# Patient Record
Sex: Female | Born: 1945 | Race: White | Hispanic: No | Marital: Single | Smoking: Former smoker
Health system: Southern US, Community
[De-identification: ages and names within clinical notes are randomized; demographics above are authoritative.]

## PROBLEM LIST (undated history)

## (undated) DIAGNOSIS — J449 Chronic obstructive pulmonary disease, unspecified: Secondary | ICD-10-CM

## (undated) DIAGNOSIS — IMO0001 Reserved for inherently not codable concepts without codable children: Secondary | ICD-10-CM

## (undated) DIAGNOSIS — I2781 Cor pulmonale (chronic): Secondary | ICD-10-CM

## (undated) DIAGNOSIS — J189 Pneumonia, unspecified organism: Secondary | ICD-10-CM

## (undated) DIAGNOSIS — K219 Gastro-esophageal reflux disease without esophagitis: Secondary | ICD-10-CM

## (undated) DIAGNOSIS — M1990 Unspecified osteoarthritis, unspecified site: Secondary | ICD-10-CM

## (undated) DIAGNOSIS — G319 Degenerative disease of nervous system, unspecified: Secondary | ICD-10-CM

## (undated) DIAGNOSIS — R519 Headache, unspecified: Secondary | ICD-10-CM

## (undated) DIAGNOSIS — I10 Essential (primary) hypertension: Secondary | ICD-10-CM

## (undated) DIAGNOSIS — I499 Cardiac arrhythmia, unspecified: Secondary | ICD-10-CM

## (undated) DIAGNOSIS — R296 Repeated falls: Secondary | ICD-10-CM

## (undated) DIAGNOSIS — R002 Palpitations: Secondary | ICD-10-CM

## (undated) DIAGNOSIS — K5900 Constipation, unspecified: Secondary | ICD-10-CM

## (undated) DIAGNOSIS — R51 Headache: Secondary | ICD-10-CM

## (undated) HISTORY — PX: EYE SURGERY: SHX253

## (undated) HISTORY — DX: Essential (primary) hypertension: I10

## (undated) HISTORY — DX: Gastro-esophageal reflux disease without esophagitis: K21.9

## (undated) HISTORY — PX: ABDOMINAL HYSTERECTOMY: SHX81

## (undated) HISTORY — PX: CHOLECYSTECTOMY: SHX55

## (undated) HISTORY — PX: COLONOSCOPY: SHX174

## (undated) HISTORY — PX: APPENDECTOMY: SHX54

---

## 2015-10-11 ENCOUNTER — Encounter (INDEPENDENT_AMBULATORY_CARE_PROVIDER_SITE_OTHER): Payer: Self-pay | Admitting: *Deleted

## 2015-11-01 ENCOUNTER — Encounter (INDEPENDENT_AMBULATORY_CARE_PROVIDER_SITE_OTHER): Payer: Self-pay | Admitting: Internal Medicine

## 2015-11-01 ENCOUNTER — Encounter (INDEPENDENT_AMBULATORY_CARE_PROVIDER_SITE_OTHER): Payer: Self-pay | Admitting: *Deleted

## 2015-11-01 ENCOUNTER — Other Ambulatory Visit (INDEPENDENT_AMBULATORY_CARE_PROVIDER_SITE_OTHER): Payer: Self-pay | Admitting: Internal Medicine

## 2015-11-01 ENCOUNTER — Ambulatory Visit (INDEPENDENT_AMBULATORY_CARE_PROVIDER_SITE_OTHER): Payer: Medicare Other | Admitting: Internal Medicine

## 2015-11-01 ENCOUNTER — Encounter (INDEPENDENT_AMBULATORY_CARE_PROVIDER_SITE_OTHER): Payer: Self-pay

## 2015-11-01 VITALS — BP 152/70 | HR 60 | Temp 98.0°F | Ht 63.0 in | Wt 165.0 lb

## 2015-11-01 DIAGNOSIS — K219 Gastro-esophageal reflux disease without esophagitis: Secondary | ICD-10-CM | POA: Diagnosis not present

## 2015-11-01 DIAGNOSIS — I10 Essential (primary) hypertension: Secondary | ICD-10-CM | POA: Diagnosis not present

## 2015-11-01 DIAGNOSIS — J209 Acute bronchitis, unspecified: Secondary | ICD-10-CM

## 2015-11-01 MED ORDER — AZITHROMYCIN 250 MG PO TABS: ORAL_TABLET | ORAL | Status: DC

## 2015-11-01 NOTE — Patient Instructions (Addendum)
EGD.The risks and benefits such as perforation, bleeding, and infection were reviewed with the patient and is agreeable. Protonix 30 minutes before breakfast and Zantac at night. Rx for a Z pack sent to her pharmacy for her chronic bronchitis.

## 2015-11-01 NOTE — Progress Notes (Addendum)
Subjective:    Patient ID: Angelica KerbsSylvia A Garza, female    DOB: Nov 17, 1945, 70 y.o.   MRN: [id number]  [facility]. DG esophagram on 10/02/2015 revealed no evidence of esophageal stricture. Moderate to large sliding type hiatal hernia with spontaneous gastroesophageal reflux to the thoraci inlet on supine imaging. She tells me she has a hernia. She says she has nausea and vomiting all day long.  She says she vomits any kind of food. No weight loss.  She usually has a BM x 1 a day and sometimes she may have 2 BMs.  She takes Miralax for constipation. Her last colonoscopy was just over a year by Dr. Allena KatzPatel and was normal.  10/18/2015 CT Abd/pelvis with CM: abdominal pain/ nausea and vomiting: There is no nausea before she eats.   A small hiatal hernia is identified. Diverticulosis without diverticulitis. Likely small cyst left kidney. She has seen Dr .Allena KatzPatel in the past. She has never undergone an EGD in the past.  Also c/o cough. Hx of chronic bronchitis.   Review of Systems Past Medical History  Diagnosis Date  . Hypertension   . GERD (gastroesophageal reflux disease)     Past Surgical History  Procedure Laterality Date  . Cholecystectomy      Allergies  Allergen Reactions  . Codeine     Muscle spasms    No current outpatient prescriptions on file prior to visit.   No current facility-administered medications on file prior to visit.   Current Outpatient Prescriptions  Medication Sig Dispense Refill  . amLODipine (NORVASC) 5 MG tablet Take 5 mg by mouth daily.    . butabarbital (BUTISOL) 50 MG TABS tablet Take 50 mg by mouth.    . fluticasone furoate-vilanterol (BREO ELLIPTA) 100-25 MCG/INH AEPB Inhale 1 puff into the lungs daily.    Marland Kitchen. gabapentin (NEURONTIN) 300 MG capsule Take 300 mg by mouth 3 (three) times daily.    Marland Kitchen. losartan (COZAAR) 100 MG tablet Take by mouth daily.    . Multiple Vitamins-Minerals ([employer] SPECTRAVITE ADULT 50+ PO) Take by mouth.     . oxybutynin (DITROPAN-XL) 10 MG 24 hr tablet Take 10 mg by mouth at bedtime.    . pantoprazole (PROTONIX) 40 MG tablet Take 40 mg by mouth daily.    . pregabalin (LYRICA) 100 MG capsule Take 100 mg by mouth 2 (two) times daily.    . primidone (MYSOLINE) 50 MG tablet Take 25 mg by mouth at bedtime.    . trandolapril-verapamil (TARKA) 2-180 MG tablet Take 1 tablet by mouth daily.    . traZODone (DESYREL) 100 MG tablet Take 100 mg by mouth at bedtime.     No current facility-administered medications for this visit.         Objective:   Physical ExamBlood pressure 152/70, pulse 60, temperature 98 F (36.7 C), height 5\' 3"  (1.6 m), weight 165 lb (74.844 kg). Alert and oriented. Skin warm and dry. Oral mucosa is moist.   . Sclera anicteric, conjunctivae is pink. Thyroid not enlarged. No cervical lymphadenopathy. Bilateral wheezes. Heart regular rate and rhythm.  Abdomen is soft. Bowel sounds are positive. No hepatomegaly. No abdominal masses felt. No tenderness.  No edema to lower extremities.         Assessment & Plan:  GERD not controlled at this time. DGd esophagram shows large hiatal hernia with reflux.  Continue the Protonix.  Take Zantac at night.  Keep HOB. She sleeps in a recliner.  Chronic bronchitis; Z pack sent to her pharmacy.

## 2015-11-23 ENCOUNTER — Ambulatory Visit (HOSPITAL_COMMUNITY)
Admission: RE | Admit: 2015-11-23 | Discharge: 2015-11-23 | Disposition: A | Discharge status: Home / Self Care | Payer: Medicare Other | Source: Ambulatory Visit | Attending: Internal Medicine | Admitting: Internal Medicine

## 2015-11-23 ENCOUNTER — Encounter (HOSPITAL_COMMUNITY)
Admission: RE | Disposition: A | Discharge status: Home / Self Care | Payer: Self-pay | Source: Ambulatory Visit | Attending: Internal Medicine

## 2015-11-23 ENCOUNTER — Encounter (HOSPITAL_COMMUNITY): Payer: Self-pay | Admitting: *Deleted

## 2015-11-23 DIAGNOSIS — K2950 Unspecified chronic gastritis without bleeding: Secondary | ICD-10-CM | POA: Insufficient documentation

## 2015-11-23 DIAGNOSIS — Z79899 Other long term (current) drug therapy: Secondary | ICD-10-CM | POA: Diagnosis not present

## 2015-11-23 DIAGNOSIS — K449 Diaphragmatic hernia without obstruction or gangrene: Secondary | ICD-10-CM | POA: Insufficient documentation

## 2015-11-23 DIAGNOSIS — K219 Gastro-esophageal reflux disease without esophagitis: Secondary | ICD-10-CM | POA: Diagnosis present

## 2015-11-23 DIAGNOSIS — K210 Gastro-esophageal reflux disease with esophagitis: Secondary | ICD-10-CM | POA: Diagnosis not present

## 2015-11-23 DIAGNOSIS — R002 Palpitations: Secondary | ICD-10-CM | POA: Diagnosis not present

## 2015-11-23 DIAGNOSIS — K319 Disease of stomach and duodenum, unspecified: Secondary | ICD-10-CM | POA: Insufficient documentation

## 2015-11-23 DIAGNOSIS — Z87891 Personal history of nicotine dependence: Secondary | ICD-10-CM | POA: Insufficient documentation

## 2015-11-23 DIAGNOSIS — J449 Chronic obstructive pulmonary disease, unspecified: Secondary | ICD-10-CM | POA: Insufficient documentation

## 2015-11-23 DIAGNOSIS — K259 Gastric ulcer, unspecified as acute or chronic, without hemorrhage or perforation: Secondary | ICD-10-CM | POA: Insufficient documentation

## 2015-11-23 DIAGNOSIS — K2970 Gastritis, unspecified, without bleeding: Secondary | ICD-10-CM | POA: Diagnosis not present

## 2015-11-23 DIAGNOSIS — I10 Essential (primary) hypertension: Secondary | ICD-10-CM | POA: Insufficient documentation

## 2015-11-23 HISTORY — DX: Chronic obstructive pulmonary disease, unspecified: J44.9

## 2015-11-23 HISTORY — DX: Constipation, unspecified: K59.00

## 2015-11-23 HISTORY — DX: Unspecified osteoarthritis, unspecified site: M19.90

## 2015-11-23 HISTORY — DX: Palpitations: R00.2

## 2015-11-23 HISTORY — PX: ESOPHAGOGASTRODUODENOSCOPY: SHX5428

## 2015-11-23 HISTORY — DX: Reserved for inherently not codable concepts without codable children: IMO0001

## 2015-11-23 SURGERY — EGD (ESOPHAGOGASTRODUODENOSCOPY)
Anesthesia: Moderate Sedation

## 2015-11-23 MED ORDER — BUTAMBEN-TETRACAINE-BENZOCAINE 2-2-14 % EX AERO
INHALATION_SPRAY | CUTANEOUS | Status: AC
  Filled 2015-11-23: qty 20

## 2015-11-23 MED ORDER — BUTAMBEN-TETRACAINE-BENZOCAINE 2-2-14 % EX AERO
INHALATION_SPRAY | CUTANEOUS | Status: DC | PRN
  Administered 2015-11-23: 2 via TOPICAL

## 2015-11-23 MED ORDER — METOCLOPRAMIDE HCL 10 MG PO TABS: 5.0000 mg | ORAL_TABLET | Freq: Three times a day (TID) | ORAL | Status: AC

## 2015-11-23 MED ORDER — MIDAZOLAM HCL 5 MG/5ML IJ SOLN
INTRAMUSCULAR | Status: AC
  Filled 2015-11-23: qty 10

## 2015-11-23 MED ORDER — MEPERIDINE HCL 50 MG/ML IJ SOLN
INTRAMUSCULAR | Status: DC | PRN
  Administered 2015-11-23 (×2): 25 mg via INTRAVENOUS

## 2015-11-23 MED ORDER — SODIUM CHLORIDE 0.9 % IV SOLN
INTRAVENOUS | Status: DC
  Administered 2015-11-23: 12:00:00 via INTRAVENOUS

## 2015-11-23 MED ORDER — STERILE WATER FOR IRRIGATION IR SOLN
Status: DC | PRN
  Administered 2015-11-23: 13:00:00

## 2015-11-23 MED ORDER — MIDAZOLAM HCL 5 MG/5ML IJ SOLN
INTRAMUSCULAR | Status: DC | PRN
  Administered 2015-11-23 (×2): 2 mg via INTRAVENOUS

## 2015-11-23 MED ORDER — MEPERIDINE HCL 50 MG/ML IJ SOLN
INTRAMUSCULAR | Status: AC
  Filled 2015-11-23: qty 1

## 2015-11-23 NOTE — H&P (Signed)
Angelica Garza is an 70 y.o. female.   Chief Complaint: Patient is here for EGD. HPI: Patient is 70 year old Caucasian female was at symptoms of GERD for more than 10 years. She says her symptoms been poorly controlled over the last one year. She has daily heartburn and postprandial regurgitation and/or vomiting. She denies hematemesis melena or rectal bleeding. She states she has lost pounds. She does not eat meat. She is not to meet with her dentures. Recent upper GI series revealed moderate to large sliding hiatal hernia. She has never undergone EGD before.  Past Medical History  Diagnosis Date  . Hypertension   . GERD (gastroesophageal reflux disease)   . Heart palpitations   . COPD (chronic obstructive pulmonary disease) (HCC)   . Shortness of breath dyspnea   . Constipation   . Degenerative arthritis     Past Surgical History  Procedure Laterality Date  . Cholecystectomy    . Abdominal hysterectomy    . Colonoscopy      History reviewed. No pertinent family history. Social History:  reports that she has quit smoking. Her smoking use included Cigarettes. She has a 15 pack-year smoking history. She does not have any smokeless tobacco history on file. She reports that she does not drink alcohol or use illicit drugs.  Allergies:  Allergies  Allergen Reactions  . Codeine     Muscle spasms    Medications Prior to Admission  Medication Sig Dispense Refill  . amLODipine (NORVASC) 5 MG tablet Take 5 mg by mouth daily.    Marland Kitchen. azithromycin (ZITHROMAX Z-PAK) 250 MG tablet As directed 6 each 0  . cyanocobalamin (,VITAMIN B-12,) 1000 MCG/ML injection Inject 1,000 mcg into the muscle every 30 (thirty) days.    . fluticasone furoate-vilanterol (BREO ELLIPTA) 100-25 MCG/INH AEPB Inhale 1 puff into the lungs daily.    Marland Kitchen. gabapentin (NEURONTIN) 300 MG capsule Take 300 mg by mouth 3 (three) times daily.    Marland Kitchen. guaiFENesin-codeine (ROBITUSSIN AC) 100-10 MG/5ML syrup Take 5 mLs by mouth 3 (three)  times daily as needed for cough.    . losartan (COZAAR) 100 MG tablet Take 100 mg by mouth daily.     . Multiple Vitamins-Minerals ([employer] SPECTRAVITE ADULT 50+ PO) Take 1 tablet by mouth daily.     Marland Kitchen. oxybutynin (DITROPAN-XL) 10 MG 24 hr tablet Take 10 mg by mouth 2 (two) times daily.     . pantoprazole (PROTONIX) 40 MG tablet Take 40 mg by mouth daily.    . polyethylene glycol (MIRALAX / GLYCOLAX) packet Take 17 g by mouth daily.    . pregabalin (LYRICA) 100 MG capsule Take 100 mg by mouth 2 (two) times daily.    . primidone (MYSOLINE) 50 MG tablet Take 25-50 mg by mouth at bedtime.     . ranitidine (ZANTAC) 150 MG capsule Take 150 mg by mouth every evening.    . traZODone (DESYREL) 50 MG tablet Take 50 mg by mouth at bedtime.    Marland Kitchen. umeclidinium-vilanterol (ANORO ELLIPTA) 62.5-25 MCG/INH AEPB Inhale 1 puff into the lungs daily.    . verapamil (CALAN-SR) 180 MG CR tablet Take 180 mg by mouth at bedtime.    . butalbital-acetaminophen-caffeine (FIORICET, ESGIC) 50-325-40 MG tablet Take 1 tablet by mouth daily as needed for headache.    . sucralfate (CARAFATE) 1 g tablet Take 1 g by mouth 2 (two) times daily.      No results found for this or any previous visit (from the past 48  hour(s)). No results found.  ROS  Blood pressure 149/72, pulse 71, temperature 97.5 F (36.4 C), temperature source Oral, resp. rate 19, height  (1.575 m), weight 164 lb (74.39 kg), SpO2 96 %. Physical Exam  Constitutional: She appears well-developed and well-nourished.  HENT:  Mouth/Throat: Oropharynx is clear and moist.  Eyes: Conjunctivae are normal. No scleral icterus.  Neck: No thyromegaly present.  Cardiovascular: Normal rate, regular rhythm and normal heart sounds.   No murmur heard. Respiratory: Effort normal and breath sounds normal.  GI: Soft. She exhibits no distension and no mass. There is no tenderness.  Musculoskeletal: She exhibits no edema.  Lymphadenopathy:    She has no cervical adenopathy.   Neurological: She is alert.  Skin: Skin is warm and dry.     Assessment/Plan Refractory GERD. Diagnostic EGD.  Lionel December, MD 11/23/2015, 12:58 PM

## 2015-11-23 NOTE — Discharge Instructions (Signed)
Resume usual medications and diet. New medication is metoclopramide 5 mg by mouth 30 minutes before each meal or 3 times a day. Few experience any side effects please stop the medication and call office. Keep symptom diary for 2 weeks(recurrent see of heartburn and vomiting). Physician will call with biopsy results and will arrange for surgical consultation         Esophagogastroduodenoscopy, Care After Refer to this sheet in the next few weeks. These instructions provide you with information about caring for yourself after your procedure. Your health care provider may also give you more specific instructions. Your treatment has been planned according to current medical practices, but problems sometimes occur. Call your health care provider if you have any problems or questions after your procedure. WHAT TO EXPECT AFTER THE PROCEDURE After your procedure, it is typical to feel:  Soreness in your throat.  Pain with swallowing.  Sick to your stomach (nauseous).  Bloated.  Dizzy.  Fatigued. HOME CARE INSTRUCTIONS  Do not eat or drink anything until the numbing medicine (local anesthetic) has worn off and your gag reflex has returned. You will know that the local anesthetic has worn off when you can swallow comfortably.  Do not drive or operate machinery until directed by your health care provider.  Take medicines only as directed by your health care provider. SEEK MEDICAL CARE IF:   You cannot stop coughing.  You are not urinating at all or less than usual. SEEK IMMEDIATE MEDICAL CARE IF:  You have difficulty swallowing.  You cannot eat or drink.  You have worsening throat or chest pain.  You have dizziness or lightheadedness or you faint.  You have nausea or vomiting.  You have chills.  You have a fever.  You have severe abdominal pain.  You have black, tarry, or bloody stools.   This information is not intended to replace advice given to you by your health  care provider. Make sure you discuss any questions you have with your health care provider.   Document Released: 05/26/2012 Document Revised: 06/30/2014 Document Reviewed: 05/26/2012 Elsevier Interactive Patient Education 2016 Elsevier Inc.   Hiatal Hernia A hiatal hernia occurs when part of your stomach slides above the muscle that separates your abdomen from your chest (diaphragm). You can be born with a hiatal hernia (congenital), or it may develop over time. In almost all cases of hiatal hernia, only the top part of the stomach pushes through.  Many people have a hiatal hernia with no symptoms. The larger the hernia, the more likely that you will have symptoms. In some cases, a hiatal hernia allows stomach acid to flow back into the tube that carries food from your mouth to your stomach (esophagus). This may cause heartburn symptoms. Severe heartburn symptoms may mean you have developed a condition called gastroesophageal reflux disease (GERD).  CAUSES  Hiatal hernias are caused by a weakness in the opening (hiatus) where your esophagus passes through your diaphragm to attach to the upper part of your stomach. You may be born with a weakness in your hiatus, or a weakness can develop. RISK FACTORS Older age is a major risk factor for a hiatal hernia. Anything that increases pressure on your diaphragm can also increase your risk of a hiatal hernia. This includes:  Pregnancy.  Excess weight.  Frequent constipation. SIGNS AND SYMPTOMS  People with a hiatal hernia often have no symptoms. If symptoms develop, they are almost always caused by GERD. They may include:  Heartburn.  Belching.  Indigestion.  Trouble swallowing.  Coughing or wheezing.  Sore throat.  Hoarseness.  Chest pain. DIAGNOSIS  A hiatal hernia is sometimes found during an exam for another problem. Your health care provider may suspect a hiatal hernia if you have symptoms of GERD. Tests may be done to diagnose  GERD. These may include:  X-rays of your stomach or chest.  An upper gastrointestinal (GI) series. This is an X-ray exam of your GI tract involving the use of a chalky liquid that you swallow. The liquid shows up clearly on the X-ray.  Endoscopy. This is a procedure to look into your stomach using a thin, flexible tube that has a tiny camera and light on the end of it. TREATMENT  If you have no symptoms, you may not need treatment. If you have symptoms, treatment may include:  Dietary and lifestyle changes to help reduce GERD symptoms.  Medicines. These may include:  Over-the-counter antacids.  Medicines that make your stomach empty more quickly.  Medicines that block the production of stomach acid (H2 blockers).  Stronger medicines to reduce stomach acid (proton pump inhibitors).  You may need surgery to repair the hernia if other treatments are not helping. HOME CARE INSTRUCTIONS   Take all medicines as directed by your health care provider.  Quit smoking, if you smoke.  Try to achieve and maintain a healthy body weight.  Eat frequent small meals instead of three large meals a day. This keeps your stomach from getting too full.  Eat slowly.  Do not lie down right after eating.  Do noteat 1-2 hours before bed.   Do not drink beverages with caffeine. These include cola, coffee, cocoa, and tea.  Do not drink alcohol.  Avoid foods that can make symptoms of GERD worse. These may include:  Fatty foods.  Citrus fruits.  Other foods and drinks that contain acid.  Avoid putting pressure on your belly. Anything that puts pressure on your belly increases the amount of acid that may be pushed up into your esophagus.   Avoid bending over, especially after eating.  Raise the head of your bed by putting blocks under the legs. This keeps your head and esophagus higher than your stomach.  Do not wear tight clothing around your chest or stomach.  Try not to strain when  having a bowel movement, when urinating, or when lifting heavy objects. SEEK MEDICAL CARE IF:  Your symptoms are not controlled with medicines or lifestyle changes.  You are having trouble swallowing.  You have coughing or wheezing that will not go away. SEEK IMMEDIATE MEDICAL CARE IF:  Your pain is getting worse.  Your pain spreads to your arms, neck, jaw, teeth, or back.  You have shortness of breath.  You sweat for no reason.  You feel sick to your stomach (nauseous) or vomit.  You vomit blood.  You have bright red blood in your stools.  You have black, tarry stools.    This information is not intended to replace advice given to you by your health care provider. Make sure you discuss any questions you have with your health care provider.   Document Released: 08/30/2003 Document Revised: 06/30/2014 Document Reviewed: 05/27/2013 Elsevier Interactive Patient Education [employer]2016 Elsevier Inc.

## 2015-11-23 NOTE — Op Note (Addendum)
[facility] Patient Name: Angelica Garza Procedure Date: 11/23/2015 12:37 PM MRN: [id number] Date of Birth: Oct 23, 1945 Attending MD: Lionel December , MD CSN: [id number] Age: 70 Admit Type: Outpatient Procedure:                Upper GI endoscopy Indications:              Gastro-esophageal reflux disease, Failure to                            respond to medical treatment. Providers:                Lionel December, MD, Brain Hilts, RN, Gearldine Shown,                            Technologist Referring MD:             Clarene Essex, MD Medicines:                Cetacaine spray, Meperidine 50 mg IV, Midazolam 4                            mg IV Complications:            No immediate complications. Estimated Blood Loss:     Estimated blood loss was minimal. Procedure:                Pre-Anesthesia Assessment:                           - Prior to the procedure, a History and Physical                            was performed, and patient medications and                            allergies were reviewed. The patient's tolerance of                            previous anesthesia was also reviewed. The risks                            and benefits of the procedure and the sedation                            options and risks were discussed with the patient.                            All questions were answered, and informed consent                            was obtained. Prior Anticoagulants: The patient has                            taken no previous anticoagulant or antiplatelet  agents. ASA Grade Assessment: III - A patient with                            severe systemic disease. After reviewing the risks                            and benefits, the patient was deemed in                            satisfactory condition to undergo the procedure.                           After obtaining informed consent, the endoscope was                            passed under  direct vision. Throughout the                            procedure, the patient's blood pressure, pulse, and                            oxygen saturations were monitored continuously. The                            [id number] ([id number]) scope was introduced through the                            mouth, and advanced to the second part of duodenum.                            The upper GI endoscopy was accomplished without                            difficulty. The patient tolerated the procedure                            well. Scope In: 1:08:08 PM Scope Out: 1:16:38 PM Total Procedure Duration: 0 hours 8 minutes 30 seconds  Findings:      LA Grade A (one or more mucosal breaks less than 5 mm at GEJ.      A medium-sized hiatal hernia was present with two Sheria Lang ulcers was       found.      Patchy mild inflammation characterized by congestion (edema), erythema       and granularity was found in the gastric antrum. Biopsies were taken       with a cold forceps for histology.      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      A medium-sized hiatal hernia with two Sheria Lang ulcers was found. Impression:               - LA Grade A reflux esophagitis at GEJ.                           -  Medium-sized hiatal hernia.                           - two Cameron ulcers.                           - Gastritis. Biopsied.                           - Focal intestinal at pyloric channel                           - Normal ampulla and second portion of the duodenum. Moderate Sedation:      Moderate (conscious) sedation was administered by the endoscopy nurse       and supervised by the endoscopist. The following parameters were       monitored: oxygen saturation, heart rate, blood pressure, CO2       capnography and response to care. Total physician intraservice time was       14 minutes. Recommendation:           - Patient has a contact  number available for                            emergencies. The signs and symptoms of potential                            delayed complications were discussed with the                            patient. Return to normal activities tomorrow.                            Written discharge instructions were provided to the                            patient.                           - Resume previous diet today.                           - Continue present medications.                           - Await pathology results.                           - Use metoclopramide (Reglan) 5 mg PO 30 minutes                            before meal.                           - Refer to a surgeon-office will call you with an                            appointment. Procedure  Code(s):        --- Professional ---                           [phone], Esophagogastroduodenoscopy, flexible,                            transoral; with biopsy, single or multiple                           99152, Moderate sedation services provided by the                            same physician or other qualified health care                            professional performing the diagnostic or                            therapeutic service that the sedation supports,                            requiring the presence of an independent trained                            observer to assist in the monitoring of the                            patient's level of consciousness and physiological                            status; initial 15 minutes of intraservice time,                            patient age 30 years or older Diagnosis Code(s):        --- Professional ---                           K21.0, Gastro-esophageal reflux disease with                            esophagitis                           K44.9, Diaphragmatic hernia without obstruction or                            gangrene                           K25.9, Gastric ulcer,  unspecified as acute or                            chronic, without hemorrhage or perforation                           K29.70, Gastritis, unspecified,  without bleeding CPT copyright 2016 American Medical Association. All rights reserved. The codes documented in this report are preliminary and upon coder review may  be revised to meet current compliance requirements. Lionel December, MD Lionel December, MD 11/23/2015 1:40:34 PM This report has been signed electronically. Number of Addenda: 0

## 2015-11-27 ENCOUNTER — Encounter (HOSPITAL_COMMUNITY): Payer: Self-pay | Admitting: Internal Medicine

## 2015-12-10 ENCOUNTER — Ambulatory Visit: Payer: Self-pay | Admitting: Surgery

## 2016-01-15 ENCOUNTER — Encounter: Payer: Self-pay | Admitting: Surgery

## 2016-01-15 ENCOUNTER — Ambulatory Visit: Payer: Self-pay | Admitting: Surgery

## 2016-01-15 NOTE — H&P (Signed)
Angelica Garza. [name] 12/10/2015 1:36 PM Location: [facility] Patient #: [id number] DOB: 09-Sep-1945 Married / Language: English / Race: White Female  Patient Care Team: Angelica Payor, MD as PCP - General (Internal Medicine) Angelica Hippo, MD as Consulting Physician (Gastroenterology) Angelica Garza as Consulting Physician (Otolaryngology) Angelica November, MD as Referring Physician (Cardiology) Angelica Soda, MD as Consulting Physician (General Surgery)   History of Present Illness ( The patient is a 70 year old female who presents with a hiatal hernia. Note for "Hiatal hernia": Patient sent for surgical consultation by gastroenterologist, Angelica Garza. Concern for worsening paraesophageal hiatal hernia. Probable delayed gastric emptying as well.  Pleasant elderly woman. Comes today with her caregiver, Angelica Garza. Has been struggling with heartburn and reflux. Being told she had a hiatal hernia. Has had most of her care up in [place], [place]. Had worsening heartburn. Refractory to medicine. Progressed. She obtained a second opinion with Angelica Garza and Angelica Garza. 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 Had CT scan which showed sliding anal hernia. Had been seen by otolaryngologist Angelica Garza as well. Sliding hiatal hernia noted with spontaneous reflux Aptil 2017. She also had a gastric empying study 2016 last year. Caregiver recalls 30% emptying but does not have test. I did not yet have those records. Surgical consultation requested to see if she would benefit from hiatal hernia surgery. Patient had episode of pneumonia a few years ago while traveling. Resolved with antibiotics. She has a pulmonologist. On an inhaler. No other pneumonia since. Patient does have dysphagia to solids. Not so much with liquids. Food sticks. She gets very full and bloated and vomits. Concern for delayed gastric emptying and started on metoclopramide. Patient thinks it  may help a little bit. Caregiver hasn't noticed any improvement. Because of discussion of surgery, patient mentioned it to her cardiologist. She recalls verbally being told that she could tolerate an operation. I do not have those records yet either.  Patient had open cholecystectomy and hysterectomy in the very distant past. Some question of an umbilical hernia repaired at the time of her hysterectomy. She has similar cerebellar degeneration and so needs a walker to prevent falls. Otherwise claims she can walk 20 minutes with a walker without difficulty. No shortness of breath or exertional pain. Patient not the most reliable historian though.   Other Problems Angelica Garza, CMA; 12/10/2015 1:37 PM) Chronic Obstructive Lung Disease Gastric Ulcer Gastroesophageal Reflux Disease Ventral Hernia Repair  Past Surgical History (Angelica Garza, CMA; 12/10/2015 1:37 PM) Hysterectomy (not due to cancer) - Partial  Diagnostic Studies History Angelica Garza, CMA; 12/10/2015 1:37 PM) Mammogram within last year Pap Smear >5 years ago  Allergies Angelica Garza, CMA; 12/10/2015 1:38 PM) No Known Drug Allergies06/19/2017  Medication History (Angelica Garza, CMA; 12/10/2015 1:55 PM) AmLODIPine Besylate (  Tablet, Oral) Active. Butalbital-APAP-Caffeine (50-325-40MG  Tablet, Oral) Active. Gabapentin (  Capsule, Oral) Active. Losartan Potassium (  Tablet, Oral) Active. Oxybutynin Chloride ER (  Tablet ER 24HR, Oral) Active. Pantoprazole Sodium (  Tablet DR, Oral) Active. Lyrica (  Capsule, Oral) Active. Primidone (  Tablet, Oral) Active. RaNITidine HCl (  Tablet, Oral) Active. TraZODone HCl (  Tablet, Oral) Active. Verapamil HCl ER (  Tablet ER, Oral) Active. Metoclopramide HCl (  Tablet, Oral) Active. Betamethasone Dipropionate (0.05% Cream, External) Active. Sucralfate (1GM Tablet, Oral) Active. Cyanocobalamin (1000MCG/ML  Solution, Injection) Active. Multivitamins (Oral) Active. Fluticasone Furoate (100MCG/ACT Aero Pow Br Act, Inhalation) Active. Guaifenesin (  Capsule, Oral) Active. Medications Reconciled  Social History Angelica Garza, CMA; 12/10/2015 1:37 PM) Alcohol use  Occasional alcohol use. Caffeine use Coffee. No drug use Tobacco use Former smoker.  Family History Angelica Garza, CMA; 12/10/2015 1:37 PM) Alcohol Abuse Mother. Heart Disease Father. Respiratory Condition Mother.  Pregnancy / Birth History Angelica Garza, CMA; 12/10/2015 1:37 PM) Age at menarche 13 years. Age of menopause 37-55 Gravida 6 Irregular periods Maternal age 78-20 Para 5    Review of Systems Angelica Garza CMA; 12/10/2015 1:37 PM) General Present- Appetite Loss and Fatigue. Not Present- Chills, Fever, Night Sweats, Weight Gain and Weight Loss. Skin Not Present- Change in Wart/Mole, Dryness, Hives, Jaundice, New Lesions, Non-Healing Wounds, Rash and Ulcer. HEENT Present- Wears glasses/contact lenses. Not Present- Earache, Hearing Loss, Hoarseness, Nose Bleed, Oral Ulcers, Ringing in the Ears, Seasonal Allergies, Sinus Pain, Sore Throat, Visual Disturbances and Yellow Eyes. Respiratory Present- Difficulty Breathing and Snoring. Not Present- Bloody sputum, Chronic Cough and Wheezing. Breast Not Present- Breast Mass, Breast Pain, Nipple Discharge and Skin Changes. Cardiovascular Present- Palpitations. Not Present- Chest Pain, Difficulty Breathing Lying Down, Leg Cramps, Rapid Heart Rate, Shortness of Breath and Swelling of Extremities. Gastrointestinal Present- Difficulty Swallowing, Indigestion and Vomiting. Not Present- Abdominal Pain, Bloating, Bloody Stool, Change in Bowel Habits, Chronic diarrhea, Constipation, Excessive gas, Gets full quickly at meals, Hemorrhoids, Nausea and Rectal Pain. Female Genitourinary Not Present- Frequency, Nocturia, Painful Urination, Pelvic Pain and  Urgency. Neurological Present- Headaches and Trouble walking. Not Present- Decreased Memory, Fainting, Numbness, Seizures, Tingling, Tremor and Weakness. Psychiatric Not Present- Anxiety, Bipolar, Change in Sleep Pattern, Depression, Fearful and Frequent crying. Endocrine Not Present- Cold Intolerance, Excessive Hunger, Hair Changes, Heat Intolerance, Hot flashes and New Diabetes. Hematology Not Present- Easy Bruising, Excessive bleeding, Gland problems, HIV and Persistent Infections.  Vitals (Angelica Garza CMA; 12/10/2015 1:38 PM) 12/10/2015 1:38 PM Weight: 167 lb Pulse: 74 (Regular)  BP: 132/74 (Sitting, Left Arm, Standard)       Physical Exam Ardeth Sportsman MD; 12/10/2015 7:32 PM) General Mental Status-Alert. General Appearance-Not in acute distress, Not Sickly. Orientation-Oriented X3. Hydration-Well hydrated. Voice-Normal.  Integumentary Global Assessment Upon inspection and palpation of skin surfaces of the - Axillae: non-tender, no inflammation or ulceration, no drainage. and Distribution of scalp and body hair is normal. General Characteristics Temperature - normal warmth is noted.  Head and Neck Head-normocephalic, atraumatic with no lesions or palpable masses. Face Global Assessment - atraumatic, no absence of expression. Neck Global Assessment - no abnormal movements, no bruit auscultated on the right, no bruit auscultated on the left, no decreased range of motion, non-tender. Trachea-midline. Thyroid Gland Characteristics - non-tender.  Eye Eyeball - Left-Extraocular movements intact, No Nystagmus. Eyeball - Right-Extraocular movements intact, No Nystagmus. Cornea - Left-No Hazy. Cornea - Right-No Hazy. Sclera/Conjunctiva - Left-No scleral icterus, No Discharge. Sclera/Conjunctiva - Right-No scleral icterus, No Discharge. Pupil - Left-Direct reaction to light normal. Pupil - Right-Direct reaction to light  normal.  ENMT Ears Pinna - Left - no drainage observed, no generalized tenderness observed. Right - no drainage observed, no generalized tenderness observed. Nose and Sinuses External Inspection of the Nose - no destructive lesion observed. Inspection of the nares - Left - quiet respiration. Right - quiet respiration. Mouth and Throat Lips - Upper Lip - no fissures observed, no pallor noted. Lower Lip - no fissures observed, no pallor noted. Nasopharynx - no discharge present. Oral Cavity/Oropharynx - Tongue - no dryness observed. Oral Mucosa - no cyanosis observed. Hypopharynx - no evidence of airway distress observed.  Chest and Lung Exam Inspection Movements - Normal and Symmetrical. Accessory muscles -  No use of accessory muscles in breathing. Palpation Palpation of the chest reveals - Non-tender. Auscultation Breath sounds - Normal and Clear.  Cardiovascular Auscultation Rhythm - Regular. Murmurs & Other Heart Sounds - Auscultation of the heart reveals - No Murmurs and No Systolic Clicks.  Abdomen Inspection Inspection of the abdomen reveals - No Visible peristalsis and No Abnormal pulsations. Umbilicus - No Bleeding, No Urine drainage. Palpation/Percussion Palpation and Percussion of the abdomen reveal - Soft, Non Tender, No Rebound tenderness, No Rigidity (guarding) and No Cutaneous hyperesthesia. Note: Obese but soft. Old right subcostal and low midline incisions. No hernias. No peritonitis. No guarding. Mild discomfort epigastric region.   Female Genitourinary Sexual Maturity Tanner 5 - Adult hair pattern. Note: No vaginal bleeding nor discharge   Peripheral Vascular Upper Extremity Inspection - Left - No Cyanotic nailbeds, Not Ischemic. Right - No Cyanotic nailbeds, Not Ischemic.  Neurologic Neurologic evaluation reveals -normal attention span and ability to concentrate, able to name objects and repeat phrases. Appropriate fund of knowledge , normal sensation  and normal coordination. Mental Status Affect - not angry, not paranoid. Cranial Nerves-Normal Bilaterally. Gait-Normal. Note: Long-term memory fair   Neuropsychiatric Mental status exam performed with findings of-able to articulate well with normal speech/language, rate, volume and coherence, thought content normal with ability to perform basic computations and apply abstract reasoning and no evidence of hallucinations, delusions, obsessions or homicidal/suicidal ideation.  Musculoskeletal Global Assessment Spine, Ribs and Pelvis - no instability, subluxation or laxity. Right Upper Extremity - no instability, subluxation or laxity. Note: Mild scoliosis of the thoracic spine. Not severe. No pain along spine moves joints pretty well   Lymphatic Head & Neck  General Head & Neck Lymphatics: Bilateral - Description - No Localized lymphadenopathy. Axillary  General Axillary Region: Bilateral - Description - No Localized lymphadenopathy. Femoral & Inguinal  Generalized Femoral & Inguinal Lymphatics: Left - Description - No Localized lymphadenopathy. Right - Description - No Localized lymphadenopathy.    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 Cameron ulcers. Refractory to high-dose proton pump inhibitors, ranitidine, Carafate.  I think she would benefit from PEH reduction and repair, but I need the rest of her records to make sure her workup is complete.  Would like manometry to get a sense of esophageal motility to make sure that's not a factor. See if she can tolerate a fundoplication to decrease reflux and improve gastric emptying.  Addendum.  Normal overall normal overall esophageal motility. Therefore plan Nissen fundoplication  Need to get copies of her esophagram.  Probably will require pyloroplasty if she truly has delayed gastric  emptying which seems to be strongly suspected by gastric emptying study. We'll try and get those records first.  Would like clearance from her cardiologist. She recalls her verbal clearance by Dr. Daryel Garza in [place]. We'll try and get records. Current Plans Pt Education - [facility] Esophageal Surgery Diet HCI (Kelilah Hebard): discussed with patient and provided information. Pt Education - [facility] Laparoscopic Surgery HCI The anatomy & physiology of the foregut and anti-reflux mechanism was discussed. The pathophysiology of hiatal herniation and GERD was discussed. Natural history risks without surgery was discussed. The patient's symptoms are not adequately controlled by medicines and other non-operative treatments. I feel the risks of no intervention will lead to serious problems that outweigh the operative risks; therefore, I recommended surgery to reduce the hiatal hernia out of the chest  and fundoplication to rebuild the anti-reflux valve and control reflux better. Need for a thorough workup to rule out the differential diagnosis and plan treatment was explained. I explained laparoscopic techniques with possible need for an open approach.  Risks such as bleeding, infection, abscess, leak, need for further treatment, heart attack, death, and other risks were discussed. I noted a good likelihood this will help address the problem. Goals of post-operative recovery were discussed as well. Possibility that this will not correct all symptoms was explained. Post-operative dysphagia, need for short-term liquid & pureed diet, inability to vomit, possibility of reherniation, possible need for medicines to help control symptoms in addition to surgery were discussed. We will work to minimize complications. Educational handouts further explaining the pathology, treatment options, and dysphagia diet was given as well. Questions were answered. The patient expresses understanding & wishes to proceed with surgery.  I  recommended to the patient that they have an evaluation of esophageal motility with manometry by endoscopy unit under the supervision of gastroenterology. I recommended obtaining preoperative cardiac clearance. She was just seen by her cardiologist, Dr. Daryel Garza, [facility]. She remembers her bili being told she was cleared for surgery. We would like to get those records to be safe. Make sure she doesn't need further workup. Patient and caregiver recall an echocardiogram in the last year. I am concerned about the health of the patient and the ability to tolerate the operation. Therefore, we will request clearance by cardiology to better assess operative risk & see if a reevaluation, further workup, etc is needed. Also recommendations on how medications such as for anticoagulation and blood pressure should be managed/held/restarted after surgery.  DELAYED GASTRIC EMPTYING (K30) Impression: Most likely delayed gastric emptying based on history.  Outside gastric emptying study done earlier this year shows 25% emptying at 90 minutes.  Consistent with delayed gastric emptying.  Therefore I think she would benefit from concurrent pyloroplasty.  Ardeth Sportsman, M.D., F.A.C.S. Gastrointestinal and Minimally Invasive Surgery [facility] [address] [place], [place] [address] [phone] Main / Paging

## 2016-02-26 NOTE — Patient Instructions (Addendum)
Angelica Garza  02/26/2016   Your procedure is scheduled on: 03-05-16  Report to [facility] Main  Entrance take [place]  elevators to 3rd floor to  Short Stay Center at 0615 AM.  Call this number if you have problems the morning of surgery [phone]   Remember: ONLY 1 PERSON MAY GO WITH YOU TO SHORT STAY TO GET  READY MORNING OF YOUR SURGERY.  Do not eat food or drink liquids :After Midnight.     Take these medicines the morning of surgery with A SIP OF WATER: Amlodipine, Gabapentin, Pantoprazole, Pregaabalin, oxybutin--use AnoroElilipta May use Bree-Elliptal if needed, or use Flonase                               You may not have any metal on your body including hair pins and              piercings  Do not wear jewelry, make-up, lotions, powders or perfumes, deodorant             Do not wear nail polish.  Do not shave  48 hours prior to surgery.              Men may shave face and neck.   Do not bring valuables to the hospital. [facility] IS NOT             RESPONSIBLE   FOR VALUABLES.  Contacts, dentures or bridgework may not be worn into surgery.  Leave suitcase in the car. After surgery it may be brought to your room.                Please read over the following fact sheets you were given: _____________________________________________________________________             [facility] - Preparing for Surgery Before surgery, you can play an important role.  Because skin is not sterile, your skin needs to be as free of germs as possible.  You can reduce the number of germs on your skin by washing with CHG (chlorahexidine gluconate) soap before surgery.  CHG is an antiseptic cleaner which kills germs and bonds with the skin to continue killing germs even after washing. Please DO NOT use if you have an allergy to CHG or antibacterial soaps.  If your skin becomes reddened/irritated stop using the CHG and inform your nurse when you arrive at Short Stay. Do not  shave (including legs and underarms) for at least 48 hours prior to the first CHG shower.  You may shave your face/neck. Please follow these instructions carefully:  1.  Shower with CHG Soap the night before surgery and the  morning of Surgery.  2.  If you choose to wash your hair, wash your hair first as usual with your  normal  shampoo.  3.  After you shampoo, rinse your hair and body thoroughly to remove the  shampoo.                           4.  Use CHG as you would any other liquid soap.  You can apply chg directly  to the skin and wash                       Gently with a scrungie  or clean washcloth.  5.  Apply the CHG Soap to your body ONLY FROM THE NECK DOWN.   Do not use on face/ open                           Wound or open sores. Avoid contact with eyes, ears mouth and genitals (private parts).                       Wash face,  Genitals (private parts) with your normal soap.             6.  Wash thoroughly, paying special attention to the area where your surgery  will be performed.  7.  Thoroughly rinse your body with warm water from the neck down.  8.  DO NOT shower/wash with your normal soap after using and rinsing off  the CHG Soap.                9.  Pat yourself dry with a clean towel.            10.  Wear clean pajamas.            11.  Place clean sheets on your bed the night of your first shower and do not  sleep with pets. Day of Surgery : Do not apply any lotions/deodorants the morning of surgery.  Please wear clean clothes to the hospital/surgery center.  FAILURE TO FOLLOW THESE INSTRUCTIONS MAY RESULT IN THE CANCELLATION OF YOUR SURGERY PATIENT SIGNATURE_________________________________  NURSE SIGNATURE__________________________________  ________________________________________________________________________

## 2016-02-27 ENCOUNTER — Encounter (HOSPITAL_COMMUNITY): Payer: Self-pay

## 2016-02-27 ENCOUNTER — Encounter (HOSPITAL_COMMUNITY)
Admission: RE | Admit: 2016-02-27 | Discharge: 2016-02-27 | Disposition: A | Discharge status: Home / Self Care | Payer: Medicare Other | Source: Ambulatory Visit | Attending: Surgery | Admitting: Surgery

## 2016-02-27 DIAGNOSIS — K219 Gastro-esophageal reflux disease without esophagitis: Secondary | ICD-10-CM | POA: Insufficient documentation

## 2016-02-27 DIAGNOSIS — Z01812 Encounter for preprocedural laboratory examination: Secondary | ICD-10-CM | POA: Diagnosis present

## 2016-02-27 DIAGNOSIS — R002 Palpitations: Secondary | ICD-10-CM | POA: Insufficient documentation

## 2016-02-27 DIAGNOSIS — J449 Chronic obstructive pulmonary disease, unspecified: Secondary | ICD-10-CM | POA: Diagnosis not present

## 2016-02-27 DIAGNOSIS — G43909 Migraine, unspecified, not intractable, without status migrainosus: Secondary | ICD-10-CM | POA: Insufficient documentation

## 2016-02-27 DIAGNOSIS — I10 Essential (primary) hypertension: Secondary | ICD-10-CM | POA: Diagnosis not present

## 2016-02-27 HISTORY — DX: Headache, unspecified: R51.9

## 2016-02-27 HISTORY — DX: Degenerative disease of nervous system, unspecified: G31.9

## 2016-02-27 HISTORY — DX: Cor pulmonale (chronic): I27.81

## 2016-02-27 HISTORY — DX: Repeated falls: R29.6

## 2016-02-27 HISTORY — DX: Headache: R51

## 2016-02-27 HISTORY — DX: Cardiac arrhythmia, unspecified: I49.9

## 2016-02-27 HISTORY — DX: Pneumonia, unspecified organism: J18.9

## 2016-02-27 LAB — BASIC METABOLIC PANEL
ANION GAP: 6 (ref 5–15)
BUN: 14 mg/dL (ref 6–20)
CO2: 27 mmol/L (ref 22–32)
Calcium: 9.6 mg/dL (ref 8.9–10.3)
Chloride: 104 mmol/L (ref 101–111)
Creatinine, Ser: 0.89 mg/dL (ref 0.44–1.00)
GFR calc Af Amer: >60 mL/min (ref >60)
GFR calc non Af Amer: >60 mL/min (ref >60)
GLUCOSE: 102 mg/dL — AB (ref 65–99)
POTASSIUM: 4.7 mmol/L (ref 3.5–5.1)
Sodium: 137 mmol/L (ref 135–145)

## 2016-02-27 LAB — CBC
HEMATOCRIT: 36.7 % (ref 36.0–46.0)
Hemoglobin: 12.2 g/dL (ref 12.0–15.0)
MCH: 28.5 pg (ref 26.0–34.0)
MCHC: 33.2 g/dL (ref 30.0–36.0)
MCV: 85.7 fL (ref 78.0–100.0)
Platelets: 328 10*3/uL (ref 150–400)
RBC: 4.28 MIL/uL (ref 3.87–5.11)
RDW: 12.9 % (ref 11.5–15.5)
WBC: 10.9 10*3/uL — ABNORMAL HIGH (ref 4.0–10.5)

## 2016-02-27 LAB — ABO/RH: ABO/RH(D): O NEG

## 2016-02-27 NOTE — Progress Notes (Addendum)
Stress 7/17,ekg 5/17,clearance Dr Hyacinth MeekerMiller, cardio, lov dr Hyacinth Meekermiller 5/17 ALL ON CHART, eccho 6/17- all records received 500 PM

## 2016-02-27 NOTE — Progress Notes (Signed)
Requested cardio records from Dr Hyacinth MeekerMiller in [place] for the third time

## 2016-02-29 NOTE — Progress Notes (Signed)
Late Entry from 02/27/16   PST-- Tried to obtain neuro records from Dr Clayton Lefortwsua in [place] and the number listed for him on the google search stated was out of service/ disconnected

## 2016-03-05 ENCOUNTER — Inpatient Hospital Stay (HOSPITAL_COMMUNITY)
Admission: RE | Admit: 2016-03-05 | Discharge: 2016-03-10 | DRG: 327 | Disposition: A | Discharge status: Home / Self Care | Payer: Medicare Other | Source: Ambulatory Visit | Attending: Surgery | Admitting: Surgery

## 2016-03-05 ENCOUNTER — Encounter (HOSPITAL_COMMUNITY): Payer: Self-pay | Admitting: Anesthesiology

## 2016-03-05 ENCOUNTER — Ambulatory Visit (HOSPITAL_COMMUNITY): Payer: Medicare Other | Admitting: Anesthesiology

## 2016-03-05 ENCOUNTER — Encounter (HOSPITAL_COMMUNITY)
Admission: RE | Disposition: A | Discharge status: Home / Self Care | Payer: Self-pay | Source: Ambulatory Visit | Attending: Surgery

## 2016-03-05 DIAGNOSIS — R1310 Dysphagia, unspecified: Secondary | ICD-10-CM | POA: Diagnosis present

## 2016-03-05 DIAGNOSIS — K5900 Constipation, unspecified: Secondary | ICD-10-CM | POA: Diagnosis present

## 2016-03-05 DIAGNOSIS — I480 Paroxysmal atrial fibrillation: Secondary | ICD-10-CM

## 2016-03-05 DIAGNOSIS — K219 Gastro-esophageal reflux disease without esophagitis: Secondary | ICD-10-CM | POA: Diagnosis present

## 2016-03-05 DIAGNOSIS — Z79899 Other long term (current) drug therapy: Secondary | ICD-10-CM | POA: Diagnosis not present

## 2016-03-05 DIAGNOSIS — Z9889 Other specified postprocedural states: Secondary | ICD-10-CM

## 2016-03-05 DIAGNOSIS — I10 Essential (primary) hypertension: Secondary | ICD-10-CM | POA: Diagnosis present

## 2016-03-05 DIAGNOSIS — Z8701 Personal history of pneumonia (recurrent): Secondary | ICD-10-CM

## 2016-03-05 DIAGNOSIS — Z8249 Family history of ischemic heart disease and other diseases of the circulatory system: Secondary | ICD-10-CM | POA: Diagnosis not present

## 2016-03-05 DIAGNOSIS — R296 Repeated falls: Secondary | ICD-10-CM | POA: Diagnosis present

## 2016-03-05 DIAGNOSIS — Z01810 Encounter for preprocedural cardiovascular examination: Secondary | ICD-10-CM

## 2016-03-05 DIAGNOSIS — M1990 Unspecified osteoarthritis, unspecified site: Secondary | ICD-10-CM | POA: Diagnosis present

## 2016-03-05 DIAGNOSIS — R000 Tachycardia, unspecified: Secondary | ICD-10-CM

## 2016-03-05 DIAGNOSIS — K440 Diaphragmatic hernia with obstruction, without gangrene: Secondary | ICD-10-CM | POA: Diagnosis present

## 2016-03-05 DIAGNOSIS — I483 Typical atrial flutter: Secondary | ICD-10-CM

## 2016-03-05 DIAGNOSIS — K30 Functional dyspepsia: Secondary | ICD-10-CM | POA: Diagnosis present

## 2016-03-05 DIAGNOSIS — K449 Diaphragmatic hernia without obstruction or gangrene: Secondary | ICD-10-CM

## 2016-03-05 DIAGNOSIS — K259 Gastric ulcer, unspecified as acute or chronic, without hemorrhage or perforation: Secondary | ICD-10-CM | POA: Diagnosis present

## 2016-03-05 DIAGNOSIS — Z7951 Long term (current) use of inhaled steroids: Secondary | ICD-10-CM | POA: Diagnosis not present

## 2016-03-05 DIAGNOSIS — Z5309 Procedure and treatment not carried out because of other contraindication: Secondary | ICD-10-CM | POA: Diagnosis not present

## 2016-03-05 DIAGNOSIS — J449 Chronic obstructive pulmonary disease, unspecified: Secondary | ICD-10-CM | POA: Diagnosis present

## 2016-03-05 DIAGNOSIS — Z9049 Acquired absence of other specified parts of digestive tract: Secondary | ICD-10-CM

## 2016-03-05 DIAGNOSIS — K257 Chronic gastric ulcer without hemorrhage or perforation: Secondary | ICD-10-CM

## 2016-03-05 LAB — TYPE AND SCREEN
ABO/RH(D): O NEG
Antibody Screen: NEGATIVE

## 2016-03-05 SURGERY — FUNDOPLICATION, NISSEN, ROBOT-ASSISTED, LAPAROSCOPIC
Anesthesia: General

## 2016-03-05 MED ORDER — FENTANYL CITRATE (PF) 100 MCG/2ML IJ SOLN
INTRAMUSCULAR | Status: DC | PRN
  Administered 2016-03-05 (×4): 50 ug via INTRAVENOUS

## 2016-03-05 MED ORDER — METOCLOPRAMIDE HCL 5 MG/ML IJ SOLN: 5.0000 mg | Freq: Four times a day (QID) | INTRAMUSCULAR | Status: DC | PRN

## 2016-03-05 MED ORDER — CHLORHEXIDINE GLUCONATE CLOTH 2 % EX PADS: 6.0000 | MEDICATED_PAD | Freq: Once | CUTANEOUS | Status: DC

## 2016-03-05 MED ORDER — DIPHENHYDRAMINE HCL 50 MG/ML IJ SOLN
12.5000 mg | Freq: Four times a day (QID) | INTRAMUSCULAR | Status: DC | PRN
  Administered 2016-03-07: 12.5 mg via INTRAVENOUS
  Filled 2016-03-05: qty 1

## 2016-03-05 MED ORDER — CEFAZOLIN SODIUM-DEXTROSE 2-4 GM/100ML-% IV SOLN
2.0000 g | Freq: Three times a day (TID) | INTRAVENOUS | Status: AC
  Administered 2016-03-05: 2 g via INTRAVENOUS
  Filled 2016-03-05: qty 100

## 2016-03-05 MED ORDER — ROCURONIUM BROMIDE 100 MG/10ML IV SOLN
INTRAVENOUS | Status: AC
  Filled 2016-03-05: qty 1

## 2016-03-05 MED ORDER — SODIUM CHLORIDE 0.9 % IJ SOLN
INTRAMUSCULAR | Status: AC
  Filled 2016-03-05: qty 50

## 2016-03-05 MED ORDER — FLUTICASONE FUROATE-VILANTEROL 100-25 MCG/INH IN AEPB
1.0000 | INHALATION_SPRAY | Freq: Every day | RESPIRATORY_TRACT | Status: DC
  Administered 2016-03-06 – 2016-03-10 (×5): 1 via RESPIRATORY_TRACT
  Filled 2016-03-05: qty 28

## 2016-03-05 MED ORDER — FENTANYL CITRATE (PF) 100 MCG/2ML IJ SOLN
INTRAMUSCULAR | Status: AC
  Administered 2016-03-05: 25 ug via INTRAVENOUS
  Filled 2016-03-05: qty 2

## 2016-03-05 MED ORDER — GLYCOPYRROLATE 0.2 MG/ML IV SOSY
PREFILLED_SYRINGE | INTRAVENOUS | Status: DC | PRN
  Administered 2016-03-05: .2 mg via INTRAVENOUS

## 2016-03-05 MED ORDER — ORAL CARE MOUTH RINSE
15.0000 mL | Freq: Two times a day (BID) | OROMUCOSAL | Status: DC
  Administered 2016-03-05 – 2016-03-10 (×7): 15 mL via OROMUCOSAL

## 2016-03-05 MED ORDER — BUPIVACAINE-EPINEPHRINE (PF) 0.5% -1:200000 IJ SOLN
INTRAMUSCULAR | Status: DC | PRN
  Administered 2016-03-05: 26 mL

## 2016-03-05 MED ORDER — METHOCARBAMOL 1000 MG/10ML IJ SOLN
1000.0000 mg | Freq: Four times a day (QID) | INTRAMUSCULAR | Status: DC | PRN
  Administered 2016-03-05: 1000 mg via INTRAVENOUS
  Filled 2016-03-05 (×4): qty 10

## 2016-03-05 MED ORDER — ACETAMINOPHEN 650 MG RE SUPP: 650.0000 mg | Freq: Four times a day (QID) | RECTAL | Status: DC | PRN

## 2016-03-05 MED ORDER — METOPROLOL TARTRATE 5 MG/5ML IV SOLN
5.0000 mg | Freq: Four times a day (QID) | INTRAVENOUS | Status: DC | PRN
  Administered 2016-03-07 – 2016-03-09 (×3): 5 mg via INTRAVENOUS
  Filled 2016-03-05 (×3): qty 5

## 2016-03-05 MED ORDER — LIDOCAINE HCL (CARDIAC) 20 MG/ML IV SOLN
INTRAVENOUS | Status: DC | PRN
  Administered 2016-03-05: 50 mg via INTRAVENOUS

## 2016-03-05 MED ORDER — ACETAMINOPHEN 325 MG PO TABS: 325.0000 mg | ORAL_TABLET | Freq: Four times a day (QID) | ORAL | Status: DC | PRN

## 2016-03-05 MED ORDER — CEFAZOLIN SODIUM-DEXTROSE 2-4 GM/100ML-% IV SOLN
2.0000 g | INTRAVENOUS | Status: AC
  Administered 2016-03-05: 2 g via INTRAVENOUS

## 2016-03-05 MED ORDER — ACETAMINOPHEN 500 MG PO TABS
1000.0000 mg | ORAL_TABLET | ORAL | Status: AC
  Administered 2016-03-05: 1000 mg via ORAL
  Filled 2016-03-05: qty 2

## 2016-03-05 MED ORDER — ROCURONIUM BROMIDE 100 MG/10ML IV SOLN
INTRAVENOUS | Status: AC
  Filled 2016-03-05: qty 2

## 2016-03-05 MED ORDER — ONDANSETRON HCL 4 MG/2ML IJ SOLN
4.0000 mg | Freq: Four times a day (QID) | INTRAMUSCULAR | Status: DC | PRN
  Administered 2016-03-06: 4 mg via INTRAVENOUS
  Filled 2016-03-05 (×2): qty 2

## 2016-03-05 MED ORDER — BUPIVACAINE LIPOSOME 1.3 % IJ SUSP
20.0000 mL | Freq: Once | INTRAMUSCULAR | Status: AC
  Administered 2016-03-05: 20 mL
  Filled 2016-03-05: qty 20

## 2016-03-05 MED ORDER — SODIUM CHLORIDE 0.9 % IJ SOLN
INTRAMUSCULAR | Status: DC | PRN
  Administered 2016-03-05: 14 mL

## 2016-03-05 MED ORDER — OXYCODONE HCL 5 MG/5ML PO SOLN
5.0000 mg | Freq: Once | ORAL | Status: DC | PRN
  Filled 2016-03-05: qty 5

## 2016-03-05 MED ORDER — PROPOFOL 10 MG/ML IV BOLUS
INTRAVENOUS | Status: DC | PRN
Start: 2016-03-05 — End: 2016-03-05
  Administered 2016-03-05: 100 mg via INTRAVENOUS

## 2016-03-05 MED ORDER — OXYCODONE HCL 5 MG PO TABS: 5.0000 mg | ORAL_TABLET | Freq: Once | ORAL | Status: DC | PRN

## 2016-03-05 MED ORDER — PROCHLORPERAZINE EDISYLATE 5 MG/ML IJ SOLN: 5.0000 mg | INTRAMUSCULAR | Status: DC | PRN

## 2016-03-05 MED ORDER — METRONIDAZOLE IN NACL 5-0.79 MG/ML-% IV SOLN
INTRAVENOUS | Status: AC
  Filled 2016-03-05: qty 100

## 2016-03-05 MED ORDER — LACTATED RINGERS IV SOLN
INTRAVENOUS | Status: DC | PRN
  Administered 2016-03-05 (×2): via INTRAVENOUS

## 2016-03-05 MED ORDER — FLUTICASONE PROPIONATE 50 MCG/ACT NA SUSP
1.0000 | Freq: Every day | NASAL | Status: DC
  Administered 2016-03-06 – 2016-03-10 (×5): 1 via NASAL
  Filled 2016-03-05: qty 16

## 2016-03-05 MED ORDER — 0.9 % SODIUM CHLORIDE (POUR BTL) OPTIME
TOPICAL | Status: DC | PRN
  Administered 2016-03-05: 1000 mL

## 2016-03-05 MED ORDER — LACTATED RINGERS IR SOLN
Status: DC | PRN
  Administered 2016-03-05: 1000 mL

## 2016-03-05 MED ORDER — FENTANYL CITRATE (PF) 100 MCG/2ML IJ SOLN
INTRAMUSCULAR | Status: AC
  Filled 2016-03-05: qty 4

## 2016-03-05 MED ORDER — GLYCOPYRROLATE 0.2 MG/ML IV SOSY
PREFILLED_SYRINGE | INTRAVENOUS | Status: AC
  Filled 2016-03-05: qty 3

## 2016-03-05 MED ORDER — ROCURONIUM BROMIDE 100 MG/10ML IV SOLN
INTRAVENOUS | Status: DC | PRN
  Administered 2016-03-05: 15 mg via INTRAVENOUS
  Administered 2016-03-05 (×3): 10 mg via INTRAVENOUS
  Administered 2016-03-05: 5 mg via INTRAVENOUS
  Administered 2016-03-05: 40 mg via INTRAVENOUS
  Administered 2016-03-05: 15 mg via INTRAVENOUS
  Administered 2016-03-05 (×3): 10 mg via INTRAVENOUS
  Administered 2016-03-05: 5 mg via INTRAVENOUS
  Administered 2016-03-05: 10 mg via INTRAVENOUS
  Administered 2016-03-05: 5 mg via INTRAVENOUS

## 2016-03-05 MED ORDER — METRONIDAZOLE IN NACL 5-0.79 MG/ML-% IV SOLN
500.0000 mg | INTRAVENOUS | Status: AC
  Administered 2016-03-05: 500 mg via INTRAVENOUS

## 2016-03-05 MED ORDER — LIP MEDEX EX OINT
1.0000 | TOPICAL_OINTMENT | Freq: Two times a day (BID) | CUTANEOUS | Status: DC
Start: 2016-03-05 — End: 2016-03-10
  Administered 2016-03-05 – 2016-03-10 (×10): 1 via TOPICAL
  Filled 2016-03-05 (×2): qty 7

## 2016-03-05 MED ORDER — SIMETHICONE 80 MG PO CHEW: 40.0000 mg | CHEWABLE_TABLET | Freq: Four times a day (QID) | ORAL | Status: DC | PRN

## 2016-03-05 MED ORDER — UMECLIDINIUM-VILANTEROL 62.5-25 MCG/INH IN AEPB: 1.0000 | INHALATION_SPRAY | Freq: Every day | RESPIRATORY_TRACT | Status: DC

## 2016-03-05 MED ORDER — FENTANYL CITRATE (PF) 100 MCG/2ML IJ SOLN
25.0000 ug | INTRAMUSCULAR | Status: AC | PRN
  Administered 2016-03-05 (×6): 25 ug via INTRAVENOUS

## 2016-03-05 MED ORDER — LACTATED RINGERS IV SOLN
INTRAVENOUS | Status: DC | PRN
  Administered 2016-03-05: 08:00:00 via INTRAVENOUS

## 2016-03-05 MED ORDER — GABAPENTIN 300 MG PO CAPS
300.0000 mg | ORAL_CAPSULE | ORAL | Status: AC
  Administered 2016-03-05: 300 mg via ORAL
  Filled 2016-03-05: qty 1

## 2016-03-05 MED ORDER — MAGIC MOUTHWASH
15.0000 mL | Freq: Four times a day (QID) | ORAL | Status: DC | PRN
  Filled 2016-03-05: qty 15

## 2016-03-05 MED ORDER — SUGAMMADEX SODIUM 200 MG/2ML IV SOLN
INTRAVENOUS | Status: DC | PRN
  Administered 2016-03-05: 200 mg via INTRAVENOUS

## 2016-03-05 MED ORDER — LACTATED RINGERS IV SOLN
INTRAVENOUS | Status: DC
  Administered 2016-03-05 – 2016-03-06 (×2): via INTRAVENOUS

## 2016-03-05 MED ORDER — SODIUM CHLORIDE 0.9 % IV SOLN
8.0000 mg | Freq: Four times a day (QID) | INTRAVENOUS | Status: DC | PRN
  Filled 2016-03-05: qty 4

## 2016-03-05 MED ORDER — MENTHOL 3 MG MT LOZG
1.0000 | LOZENGE | OROMUCOSAL | Status: DC | PRN
  Administered 2016-03-06: 3 mg via ORAL
  Filled 2016-03-05: qty 9

## 2016-03-05 MED ORDER — ENOXAPARIN SODIUM 40 MG/0.4ML ~~LOC~~ SOLN
40.0000 mg | SUBCUTANEOUS | Status: DC
  Administered 2016-03-06 – 2016-03-10 (×5): 40 mg via SUBCUTANEOUS
  Filled 2016-03-05 (×5): qty 0.4

## 2016-03-05 MED ORDER — PHENOL 1.4 % MT LIQD: 2.0000 | OROMUCOSAL | Status: DC | PRN

## 2016-03-05 MED ORDER — CEFAZOLIN SODIUM-DEXTROSE 2-4 GM/100ML-% IV SOLN
INTRAVENOUS | Status: AC
  Filled 2016-03-05: qty 100

## 2016-03-05 MED ORDER — LIDOCAINE 2% (20 MG/ML) 5 ML SYRINGE
INTRAMUSCULAR | Status: AC
  Filled 2016-03-05: qty 5

## 2016-03-05 MED ORDER — LACTATED RINGERS IV BOLUS (SEPSIS): 1000.0000 mL | Freq: Three times a day (TID) | INTRAVENOUS | Status: AC | PRN

## 2016-03-05 MED ORDER — HYDROMORPHONE HCL 1 MG/ML IJ SOLN
0.5000 mg | INTRAMUSCULAR | Status: DC | PRN
  Administered 2016-03-05 – 2016-03-06 (×6): 1 mg via INTRAVENOUS
  Administered 2016-03-06 (×2): 2 mg via INTRAVENOUS
  Administered 2016-03-06: 1 mg via INTRAVENOUS
  Administered 2016-03-06 – 2016-03-07 (×4): 2 mg via INTRAVENOUS
  Filled 2016-03-05 (×3): qty 1
  Filled 2016-03-05: qty 2
  Filled 2016-03-05 (×2): qty 1
  Filled 2016-03-05 (×2): qty 2
  Filled 2016-03-05: qty 1
  Filled 2016-03-05 (×3): qty 2
  Filled 2016-03-05: qty 1

## 2016-03-05 MED ORDER — PROPOFOL 10 MG/ML IV BOLUS
INTRAVENOUS | Status: AC
  Filled 2016-03-05: qty 40

## 2016-03-05 MED ORDER — BUPIVACAINE-EPINEPHRINE 0.5% -1:200000 IJ SOLN
INTRAMUSCULAR | Status: AC
  Filled 2016-03-05: qty 1

## 2016-03-05 MED ORDER — ONDANSETRON HCL 4 MG/2ML IJ SOLN
INTRAMUSCULAR | Status: DC | PRN
  Administered 2016-03-05: 4 mg via INTRAVENOUS

## 2016-03-05 MED ORDER — DEXAMETHASONE SODIUM PHOSPHATE 10 MG/ML IJ SOLN
INTRAMUSCULAR | Status: DC | PRN
  Administered 2016-03-05: 10 mg via INTRAVENOUS

## 2016-03-05 MED ORDER — HYDRALAZINE HCL 20 MG/ML IJ SOLN: 10.0000 mg | INTRAMUSCULAR | Status: DC | PRN

## 2016-03-05 MED ORDER — SUGAMMADEX SODIUM 200 MG/2ML IV SOLN
INTRAVENOUS | Status: AC
  Filled 2016-03-05: qty 2

## 2016-03-05 MED ORDER — ACETAMINOPHEN 500 MG PO TABS
1000.0000 mg | ORAL_TABLET | Freq: Three times a day (TID) | ORAL | Status: DC
  Administered 2016-03-06 – 2016-03-07 (×2): 1000 mg via ORAL
  Filled 2016-03-05 (×3): qty 2

## 2016-03-05 MED ORDER — METRONIDAZOLE IN NACL 5-0.79 MG/ML-% IV SOLN
500.0000 mg | Freq: Three times a day (TID) | INTRAVENOUS | Status: AC
  Administered 2016-03-05: 500 mg via INTRAVENOUS
  Filled 2016-03-05: qty 100

## 2016-03-05 MED ORDER — STERILE WATER FOR IRRIGATION IR SOLN
Status: DC | PRN
  Administered 2016-03-05: 1000 mL

## 2016-03-05 SURGICAL SUPPLY — 62 items
APPLIER CLIP 5 13 M/L LIGAMAX5 (MISCELLANEOUS)
APPLIER CLIP ROT 10 11.4 M/L (STAPLE)
APR CLP MED LRG 11.4X10 (STAPLE)
APR CLP MED LRG 5 ANG JAW (MISCELLANEOUS)
BLADE SURG SZ11 CARB STEEL (BLADE) ×3 IMPLANT
CHLORAPREP W/TINT 26ML (MISCELLANEOUS) ×3 IMPLANT
CLIP APPLIE 5 13 M/L LIGAMAX5 (MISCELLANEOUS) IMPLANT
CLIP APPLIE ROT 10 11.4 M/L (STAPLE) IMPLANT
COVER TIP SHEARS 8 DVNC (MISCELLANEOUS) ×1 IMPLANT
COVER TIP SHEARS 8MM DA VINCI (MISCELLANEOUS) ×2
DECANTER SPIKE VIAL GLASS SM (MISCELLANEOUS) ×3 IMPLANT
DRAIN CHANNEL 19F RND (DRAIN) ×2 IMPLANT
DRAIN PENROSE 18X1/2 LTX STRL (DRAIN) IMPLANT
DRAPE ARM DVNC X/XI (DISPOSABLE) ×4 IMPLANT
DRAPE COLUMN DVNC XI (DISPOSABLE) ×1 IMPLANT
DRAPE DA VINCI XI ARM (DISPOSABLE) ×8
DRAPE DA VINCI XI COLUMN (DISPOSABLE) ×2
DRAPE WARM FLUID 44X44 (DRAPE) ×3 IMPLANT
DRSG TEGADERM 2-3/8X2-3/4 SM (GAUZE/BANDAGES/DRESSINGS) ×12 IMPLANT
DRSG TEGADERM 4X4.75 (GAUZE/BANDAGES/DRESSINGS) ×2 IMPLANT
ELECT REM PT RETURN 15FT ADLT (MISCELLANEOUS) ×3 IMPLANT
ENDOLOOP SUT PDS II  0 18 (SUTURE)
ENDOLOOP SUT PDS II 0 18 (SUTURE) IMPLANT
EVACUATOR SILICONE 100CC (DRAIN) ×2 IMPLANT
FELT TEFLON 4 X1 (Mesh General) ×2 IMPLANT
GAUZE SPONGE 2X2 8PLY STRL LF (GAUZE/BANDAGES/DRESSINGS) ×1 IMPLANT
GLOVE ECLIPSE 8.0 STRL XLNG CF (GLOVE) ×6 IMPLANT
GLOVE INDICATOR 8.0 STRL GRN (GLOVE) ×6 IMPLANT
GOWN STRL REUS W/TWL XL LVL3 (GOWN DISPOSABLE) ×12 IMPLANT
IRRIG SUCT STRYKERFLOW 2 WTIP (MISCELLANEOUS) ×3
IRRIGATION SUCT STRKRFLW 2 WTP (MISCELLANEOUS) ×1 IMPLANT
KIT BASIN OR (CUSTOM PROCEDURE TRAY) ×3 IMPLANT
MESH PHASIX RESORB RECT 10X15 (Mesh General) ×2 IMPLANT
NDL INSUFFLATION 14GA 120MM (NEEDLE) ×1 IMPLANT
NEEDLE HYPO 22GX1.5 SAFETY (NEEDLE) ×3 IMPLANT
NEEDLE INSUFFLATION 14GA 120MM (NEEDLE) ×3 IMPLANT
PACK CARDIOVASCULAR III (CUSTOM PROCEDURE TRAY) ×3 IMPLANT
PAD POSITIONING PINK XL (MISCELLANEOUS) ×3 IMPLANT
SCISSORS LAP 5X45 EPIX DISP (ENDOMECHANICALS) ×3 IMPLANT
SEAL CANN UNIV 5-8 DVNC XI (MISCELLANEOUS) ×4 IMPLANT
SEAL XI 5MM-8MM UNIVERSAL (MISCELLANEOUS) ×8
SEALER VESSEL DA VINCI XI (MISCELLANEOUS) ×2
SEALER VESSEL EXT DVNC XI (MISCELLANEOUS) ×1 IMPLANT
SET BI-LUMEN FLTR TB AIRSEAL (TUBING) ×3 IMPLANT
SOLUTION ELECTROLUBE (MISCELLANEOUS) ×3 IMPLANT
SPONGE GAUZE 2X2 STER 10/PKG (GAUZE/BANDAGES/DRESSINGS) ×2
SPONGE LAP 18X18 X RAY DECT (DISPOSABLE) ×3 IMPLANT
SUT ETHIBOND 0 36 GRN (SUTURE) ×12 IMPLANT
SUT ETHIBOND NAB CT1 #1 30IN (SUTURE) ×6 IMPLANT
SUT MNCRL AB 4-0 PS2 18 (SUTURE) ×3 IMPLANT
SUT PROLENE 2 0 SH DA (SUTURE) ×2 IMPLANT
SUT VLOC 180 0 6IN GS21 (SUTURE) ×4 IMPLANT
SYR 20CC LL (SYRINGE) ×3 IMPLANT
SYRINGE 10CC LL (SYRINGE) ×3 IMPLANT
TIP INNERVISION DETACH 40FR (MISCELLANEOUS) IMPLANT
TIP INNERVISION DETACH 50FR (MISCELLANEOUS) IMPLANT
TIP INNERVISION DETACH 56FR (MISCELLANEOUS) ×2 IMPLANT
TIPS INNERVISION DETACH 40FR (MISCELLANEOUS)
TOWEL OR 17X26 10 PK STRL BLUE (TOWEL DISPOSABLE) ×3 IMPLANT
TOWEL OR NON WOVEN STRL DISP B (DISPOSABLE) ×3 IMPLANT
TRAY FOLEY W/METER SILVER 16FR (SET/KITS/TRAYS/PACK) ×2 IMPLANT
TROCAR ADV FIXATION 5X100MM (TROCAR) ×3 IMPLANT

## 2016-03-05 NOTE — Op Note (Signed)
03/05/2016  2:26 PM  PATIENT:  Nettie ElmSylvia A Brendel  70 y.o. female  Patient Care Team: Leone PayorVicente T Falgui, MD as PCP - General (Internal Medicine) Malissa HippoNajeeb U Rehman, MD as Consulting Physician (Gastroenterology) Cheri FowlerWilliam D Bennett as Consulting Physician (Otolaryngology) Daryel NovemberGary Miller, MD as Referring Physician (Cardiology) Karie SodaSteven Dupree Givler, MD as Consulting Physician (General Surgery)  PRE-OPERATIVE DIAGNOSIS:  Paraesophageal hiatal hernia Delayed gastric emptying.  POST-OPERATIVE DIAGNOSIS:  paraesophageal hiatal hernia delayed gastric emptying  PROCEDURE:    1. Robotic reduction of paraesophageal hiatal hernia 2. Type II mediastinal dissection. 3. Primary repair of hiatal hernia over pledgets. 4.  Reinforcement of hiatal hernia repair with absorbable mesh (Phasix 10x15cm) 5. Anterior & posterior gastropexy. 6. Nissen fundoplication 2 cm over a 56-French bougie 7.  Pyloroplasty 8.  Omentopexy   SURGEON:  Karie SodaSteven Sherlon Nied, MD  ASSIST: Axel FillerArmando Ramirez, MD - Assist Octavia Heiraroline Gamble, PA-S, [facility] - Assist   ANESTHESIA:   local and general  EBL:  Total I/O In: 1960 [I.V.:1900; IV Piggyback:60] Out: 530 [Urine:430; Blood:100]  Delay start of Pharmacological VTE agent (>24hrs) due to surgical blood loss or risk of bleeding:  no  ANESTHESIA: 1. General anesthesia. 2. Local anesthetic in a field block around all port sites.  SPECIMEN:  Mediastinal hernia sac (not sent).  DRAINS:  A 19-French Blake drain goes from the right upper quadrant along the lesser curvature of the stomach into the mediastinum.  COUNTS:  YES  PLAN OF CARE: Admit to inpatient   PATIENT DISPOSITION:  PACU - hemodynamically stable.  INDICATION:   Patient with symptomatic paraesophageal hiatal hernia.  The patient has had extensive work-up & we feel the patient will benefit from repair:  The anatomy & physiology of the foregut and anti-reflux mechanism was discussed.  The pathophysiology of hiatal  herniation and GERD was discussed.  Natural history risks without surgery was discussed.   The patient's symptoms are not adequately controlled by medicines and other non-operative treatments.  I feel the risks of no intervention will lead to serious problems that outweigh the operative risks; therefore, I recommended surgery to reduce the hiatal hernia out of the chest and fundoplication to rebuild the anti-reflux valve and control reflux better.  Need for a thorough workup to rule out the differential diagnosis and plan treatment was explained.  I explained laparoscopic techniques with possible need for an open approach.  Risks such as bleeding, infection, abscess, leak, need for further treatment, heart attack, death, and other risks were discussed.   I noted a good likelihood this will help address the problem.  Goals of post-operative recovery were discussed as well.  Possibility that this will not correct all symptoms was explained.  Post-operative dysphagia, need for short-term liquid & pureed diet, inability to vomit, possibility of reherniation, possible need for medicines to help control symptoms in addition to surgery were discussed.  We will work to minimize complications.   Educational handouts further explaining the pathology, treatment options, and dysphagia diet was given as well.  Questions were answered.  The patient expresses understanding & wishes to proceed with surgery.  OR FINDINGS:   Moderate-sized paraesophageal hiatal hernia with 33% of the stomach in the mediastinum.  There was a 8 x 6 cm hiatal defect.  It is a primary repair over pledgets.   Also has an onlay mesh reinforcement with absorbable Phasix Mesh (a knitted monofilament mesh scaffold using Poly-4-hydroxybutyrate (P4HB), a biologically derived, fully resorbable material).  The patient has a 2 cm Nissen fundoplication  that was done over 56-French bougie.  The patient has had anterior and posterior  gastropexy.  Pyloroplasty done because of significantly gastric emptying.  Closed with running absorbable field block suture from each corner in a running [place] suture  DESCRIPTION:   Informed consent was confirmed.  The patient received IV antibiotics prior to incision.  The underwent general anesthesia without difficulty.  A Foley catheter sterilely placed.  The patient was positioned in split leg with arms tucked. The abdomen was prepped and draped in the sterile fashion.  Surgical time-out confirmed our plan.  I placed a 8 mm port in the left upper quadrant anterior axillary line using a Varess technique with the patient in steep reverse Trendelenburg and left side up.  Entry was clean.  We induced carbon dioxide insufflation.  Camera inspection revealed no injury.  Under direct visualization, I placed additional robotic and laparoscopic ports.  I also placed a 5 mm port in the left subxiphoid region under direct visualization.  I removed that and placed an Omega-shaped rigid Nathanson liver retractor to lift the left lateral sector of the liver anteriorly to expose the esophageal hiatus.  This was secured to the bed using the iron man system.   We docked the XI robot.  We could see about a third of the stomach up and a moderate size hiatal hernia.  We grasped the anterior mediastinal sac at the apex of the crus.  I scored through that and got into the anterior mediastinum.  I was able to free the mediastinal sac from its attachments to the pericardium and bilateral pleura using primarily focused gentle blunt dissection as well as focused ultrasonic Harmonic dissection.  I transected phrenoesophageal attachments to the inner right crus, preserving a two centimeter cuff of mediastinal sac until I found the base of the crura.  I then came around anteriorly on the left side and freed up the phrenoesophageal attachments of the mediastinal sac on the medial part of the left crus on the superior half.  I  did careful mediastinal dissection to free the mediastinal sac.  With that, we could relieve the suction cup affect of the hernia sac and help reduce the stomach back down into the abdomen, flipped back approriately.    We ligated the short gastrics along the lesser curvature of the stomach about a third way down and then came up proximally over the fundus.  We released the attachments of the stomach to the retroperitoneum until we were able to connect with the prior dissection on the left crus.  We completed the release of phrenoesophageal attachments to the medial part of the left crus down to its base.  With this, we had circumferential mobilization.    We placed the stomach and esophagus on axial tension.  I then did a Type II mediastinal dissection where I freed the esophagus from its attachments to the aorta, spine, pleura, and pericardium using primarily gentle blunt as well as focused ultrasonic dissection.  We saw the anterior & posterior vagus nerves intact.  We preserved it at all times.  I procedded to dissect about 20 cm proximally into the mediastinum.  With that I could straighten out the esophagus and get 3 cm of intra-abdominal length of the esophagus at a best estimation.  I freed the anterior mediastinal sac off the esophagus & stomach.  The sacs were actually rather thin.    We saw the anterior vagus nerve and freed the sac off of the vagus.  I dissected out & removed the fatty  epiphrenic pads at the esophagogastric junction. With that, I could better define the esophagogastric junction.  I confirmed the the patient had 3 cm of intra-abdominal esophageal length off tension.  I brought the fundus of the stomach posterior to the esophagus over to the right side.  The wrap was mobile with the classic shoe shine maneuver.  Wrap became together gently.  We reflected the stomach left laterally and closed the esophageal hiatus using 0 Ethibond stitch using horizontal mattress stitches with  pledgets on both sides.  I did that x3 stitches.  The crura had good substance and they came together well without any tension.  However given the decent size and some tension, I decided to reinforce the repair with absorbable Phasix mesh.  I cut it in a modified U-shape, rolled in the abdomen and laid it on the crura closure.  Brought up anterior tails of the "U" up both sides of the crura..  Prominent part laid on the left diaphragm between the spleen & diaphragm.  Secured the mesh with interrupted Ethibond sutures at the upper inner corners near the apexes of the right and left crura.  Also placed stitches at the: Left upper outer corner.  Right lower outer corner.  Then did some #1 Ethibond sutures through the mesh, crural closure, and out the mesh and tied it down to help secure the mesh centrally as well.  I reset up the and Nissen wrap and did a posterior gastropexy by taking  the posterior crural closure sutures of the mesh #1 Ethibond stitches to the posterior part of the right side of the wrap and thru the crural closure and tied that down for good posterior gastropexy up against the hiatal closure.  I then did a left anterior gastropexy taking the apex of the left side of the wrap and  tacked it to the left anterior crura just posterior to the phrenic vein. I tied that down.  This helped the wrap for completion.    Next, Anesthesia passed a %6-French bougie transorally.  It was a lighted bougie and we did do this under axial tension.  It passed down easily without resistance.    I then did a classic 2 cm Nissen fundoplication on the true esophagus above the cardia using Ethibond stitch in the left side of the wrap, then anterior esophagus, and then right side of the wrap and tied that down. Did 3 stitches.  I measured it and it was 2 cm in length.  We removed the bougie.  It was intact.    I then proceeded with pyloroplasty.  Lysed the antrum of the stomach duodenal bulb off the liver from her  prior cholecystectomy.  Also freed the proximal transverse colon as well.  A good mobility.  Five the junction between the stomach and the duodenum.  This seemed thickened and fibrotic.  It with pyloroplasty.  She come through the anterior distal antrum.  Came across the pylorus muscle through the mucosa.  Continued distally until I got into the duodenal bulb.  A nice brought pyloroplasty.  We paced the nasogastric tube across into the duodenal bulb.  I then closed the pyloroplasty transversely using absorbable serrated suture in a running [place] fashion from each corner (2-0 V-lock).  This help imbricate the pyloroplasty well with a nice broad closure.  To make sure we had a good airtight closure, I clamped off the duodenum and the stomach was insufflated with oxygen.  Irrigation was done over.  This proved an airtight seal without any ischemia.  To protect the pyloroplasty, proceeded with a pyloroplasty using primarily the falciform ligament.  There was not good mobile omentum nearby given her prior open cholecystectomy.  I mobilized the healthy broad falciform ligament off of the anterior abdominal wall and laid it over the closure.  I placed sutures through that for an omentopexy.    The wrap was soft and floppy.  I placed a drain as noted above.  I did irrigation and ensured hemostasis.  I saw no evidence of any leak or perforation or other abnormality.  I removed the [facility] liver retractor under direct visualization.  I evacuated carbon dioxide and removed the ports.  The skin was closed with Monocryl and sterile dressings applied.  The patient is being extubated and brought back to the recovery room.  I discussed postop care in detail with the patient and family in in the office.  Discussed again with the patient in the holding area. I discussed operative findings, updated the patient's status, discussed probable steps to recovery, and gave postoperative recommendations to the patient's family.   Recommendations were made.  Questions were answered.  They expressed understanding & appreciation.   Ardeth Sportsman, M.D., F.A.C.S. Gastrointestinal and Minimally Invasive Surgery [facility] [address], [address] [place], [place] [address] [phone] Main / Paging

## 2016-03-05 NOTE — H&P (Signed)
Angelica MemosSylvia H. Garza  Location: [facility] Garza #: [id number] DOB: 1945/07/07 Married / Language: English / Race: White Female  Garza Care Team: Angelica PayorVicente T Falgui, MD as PCP - General (Internal Medicine) Angelica HippoNajeeb U Rehman, MD as Consulting Physician (Gastroenterology) Angelica Garza as Consulting Physician (Otolaryngology) Angelica NovemberGary Miller, MD as Referring Physician (Cardiology) Angelica SodaSteven Mukesh Kornegay, MD as Consulting Physician (General Surgery)   History of Present Illness Angelica Garza(Angelica Pies C. Diane Mochizuki MD; 12/10/2015 7:30 PM) The Garza is a 70 year old female who presents with a hiatal hernia. Note for "Hiatal hernia": Garza sent for surgical consultation by gastroenterologist, Angelica Garza. Concern for worsening paraesophageal hiatal hernia. Probable delayed gastric emptying as well.  Pleasant elderly woman. Comes today with her caregiver, Angelica HarmanDana. Has been struggling with heartburn and reflux. Being told she had a hiatal hernia. Has had most of [place] in [place]. Had worsening heartburn. Refractory to medicine. Progressed. She obtained a second opinion with Dr. Karilyn Garza and [place]. 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 Had CT scan which showed sliding anal hernia. Had been seen by otolaryngologist Dr. Willeen Garza as well. Sliding hiatal hernia noted with spontaneous reflux Aptil 2017. She also had a gastric empying study 2016 last year. Caregiver recalls 30% emptying but does not have test. I did not yet have those records. Surgical consultation requested to see if she would benefit from hiatal hernia surgery. Garza had episode of pneumonia a few years ago while traveling. Resolved with antibiotics. She has a pulmonologist. On an inhaler. No other pneumonia since. Garza does have dysphagia to solids. Not so much with liquids. Food sticks. She gets very full and bloated and vomits. Concern for delayed gastric emptying and started on metoclopramide.  Garza thinks it may help a little bit. Caregiver hasn't noticed any improvement. Because of discussion of surgery, Garza mentioned it to her cardiologist. She recalls verbally being told that she could tolerate an operation. I do not have those records yet either.  Garza had open cholecystectomy and hysterectomy in the very distant past. Some question of an umbilical hernia repaired at the time of her hysterectomy. She has similar cerebellar degeneration and so needs a walker to prevent falls. Otherwise claims she can walk 20 minutes with a walker without difficulty. No shortness of breath or exertional pain. Garza not the most reliable historian though.   Other Problems Angelica Garza(Angelica Garza; 12/10/2015 1:37 PM) Chronic Obstructive Lung Disease Gastric Ulcer Gastroesophageal Reflux Disease Ventral Hernia Repair  Past Surgical History (Angelica Garza; 12/10/2015 1:37 PM) Hysterectomy (not due to cancer) - Partial  Diagnostic Studies History Angelica Garza(Angelica Garza; 12/10/2015 1:37 PM) Mammogram within last year Pap Smear >5 years ago  Allergies Angelica Garza(Angelica Garza; 12/10/2015 1:38 PM) No Known Drug Allergies06/19/2017  Medication History (Angelica Garza; 12/10/2015 1:55 PM) AmLODIPine Besylate (5MG  Tablet, Oral) Active. Butalbital-APAP-Caffeine (50-325-40MG  Tablet, Oral) Active. Gabapentin (300MG  Capsule, Oral) Active. Losartan Potassium (100MG  Tablet, Oral) Active. Oxybutynin Chloride ER (10MG  Tablet ER 24HR, Oral) Active. Pantoprazole Sodium (40MG  Tablet DR, Oral) Active. Lyrica (100MG  Capsule, Oral) Active. Primidone (50MG  Tablet, Oral) Active. RaNITidine HCl (150MG  Tablet, Oral) Active. TraZODone HCl (50MG  Tablet, Oral) Active. Verapamil HCl ER (180MG  Tablet ER, Oral) Active. Metoclopramide HCl (10MG  Tablet, Oral) Active. Betamethasone Dipropionate (0.05% Cream, External) Active. Sucralfate (1GM Tablet, Oral)  Active. Cyanocobalamin (1000MCG/ML Solution, Injection) Active. Multivitamins (Oral) Active. Fluticasone Furoate (100MCG/ACT Aero Pow Br Act, Inhalation) Active. Guaifenesin (300MG  Capsule, Oral) Active. Medications Reconciled  Social History Angelica Garza(Angelica Garza; 12/10/2015  1:37 PM) Alcohol use Occasional alcohol use. Caffeine use Coffee. No drug use Tobacco use Former smoker.  Family History Angelica Garza, Garza; 12/10/2015 1:37 PM) Alcohol Abuse Mother. Heart Disease Father. Respiratory Condition Mother.  Pregnancy / Birth History Angelica Garza, Garza; 12/10/2015 1:37 PM) Age at menarche 13 years. Age of menopause 21-55 Gravida 6 Irregular periods Maternal age 97-20 Para 5    Review of Systems Angelica Garza Garza; 12/10/2015 1:37 PM) General Present- Appetite Loss and Fatigue. Not Present- Chills, Fever, Night Sweats, Weight Gain and Weight Loss. Skin Not Present- Change in Wart/Mole, Dryness, Hives, Jaundice, New Lesions, Non-Healing Wounds, Rash and Ulcer. HEENT Present- Wears glasses/contact lenses. Not Present- Earache, Hearing Loss, Hoarseness, Nose Bleed, Oral Ulcers, Ringing in the Ears, Seasonal Allergies, Sinus Pain, Sore Throat, Visual Disturbances and Yellow Eyes. Respiratory Present- Difficulty Breathing and Snoring. Not Present- Bloody sputum, Chronic Cough and Wheezing. Breast Not Present- Breast Mass, Breast Pain, Nipple Discharge and Skin Changes. Cardiovascular Present- Palpitations. Not Present- Chest Pain, Difficulty Breathing Lying Down, Leg Cramps, Rapid Heart Rate, Shortness of Breath and Swelling of Extremities. Gastrointestinal Present- Difficulty Swallowing, Indigestion and Vomiting. Not Present- Abdominal Pain, Bloating, Bloody Stool, Change in Bowel Habits, Chronic diarrhea, Constipation, Excessive gas, Gets full quickly at meals, Hemorrhoids, Nausea and Rectal Pain. Female Genitourinary Not Present- Frequency, Nocturia, Painful  Urination, Pelvic Pain and Urgency. Neurological Present- Headaches and Trouble walking. Not Present- Decreased Memory, Fainting, Numbness, Seizures, Tingling, Tremor and Weakness. Psychiatric Not Present- Anxiety, Bipolar, Change in Sleep Pattern, Depression, Fearful and Frequent crying. Endocrine Not Present- Cold Intolerance, Excessive Hunger, Hair Changes, Heat Intolerance, Hot flashes and New Diabetes. Hematology Not Present- Easy Bruising, Excessive bleeding, Gland problems, HIV and Persistent Infections.  Vitals (Angelica Garza Garza; 12/10/2015 1:38 PM) 12/10/2015 1:38 PM Weight: 167 lb Pulse: 74 (Regular)  BP: 132/74 (Sitting, Left Arm, Standard)    BP (!) 128/53   Pulse 90   Temp 98.6 F (37 C) (Oral)   Resp 18   Ht 5\' 3"  (1.6 m)   Wt 75.3 kg (166 lb)   SpO2 96%   BMI 29.41 kg/m    Physical Exam Angelica Sportsman MD; 12/10/2015 7:32 PM) General Mental Status-Alert. General Appearance-Not in acute distress, Not Sickly. Orientation-Oriented X3. Hydration-Well hydrated. Voice-Normal.  Integumentary Global Assessment Upon inspection and palpation of skin surfaces of the - Axillae: non-tender, no inflammation or ulceration, no drainage. and Distribution of scalp and body hair is normal. General Characteristics Temperature - normal warmth is noted.  Head and Neck Head-normocephalic, atraumatic with no lesions or palpable masses. Face Global Assessment - atraumatic, no absence of expression. Neck Global Assessment - no abnormal movements, no bruit auscultated on the right, no bruit auscultated on the left, no decreased range of motion, non-tender. Trachea-midline. Thyroid Gland Characteristics - non-tender.  Eye Eyeball - Left-Extraocular movements intact, No Nystagmus. Eyeball - Right-Extraocular movements intact, No Nystagmus. Cornea - Left-No Hazy. Cornea - Right-No Hazy. Sclera/Conjunctiva - Left-No scleral icterus, No  Discharge. Sclera/Conjunctiva - Right-No scleral icterus, No Discharge. Pupil - Left-Direct reaction to light normal. Pupil - Right-Direct reaction to light normal.  ENMT Ears Pinna - Left - no drainage observed, no generalized tenderness observed. Right - no drainage observed, no generalized tenderness observed. Nose and Sinuses External Inspection of the Nose - no destructive lesion observed. Inspection of the nares - Left - quiet respiration. Right - quiet respiration. Mouth and Throat Lips - Upper Lip - no fissures observed, no pallor noted. Lower Lip -  no fissures observed, no pallor noted. Nasopharynx - no discharge present. Oral Cavity/Oropharynx - Tongue - no dryness observed. Oral Mucosa - no cyanosis observed. Hypopharynx - no evidence of airway distress observed.  Chest and Lung Exam Inspection Movements - Normal and Symmetrical. Accessory muscles - No use of accessory muscles in breathing. Palpation Palpation of the chest reveals - Non-tender. Auscultation Breath sounds - Normal and Clear.  Cardiovascular Auscultation Rhythm - Regular. Murmurs & Other Heart Sounds - Auscultation of the heart reveals - No Murmurs and No Systolic Clicks.  Abdomen Inspection Inspection of the abdomen reveals - No Visible peristalsis and No Abnormal pulsations. Umbilicus - No Bleeding, No Urine drainage. Palpation/Percussion Palpation and Percussion of the abdomen reveal - Soft, Non Tender, No Rebound tenderness, No Rigidity (guarding) and No Cutaneous hyperesthesia. Note: Obese but soft. Old right subcostal and low midline incisions. No hernias. No peritonitis. No guarding. Mild discomfort epigastric region.   Female Genitourinary Sexual Maturity Tanner 5 - Adult hair pattern. Note: No vaginal bleeding nor discharge   Peripheral Vascular Upper Extremity Inspection - Left - No Cyanotic nailbeds, Not Ischemic. Right - No Cyanotic nailbeds, Not  Ischemic.  Neurologic Neurologic evaluation reveals -normal attention span and ability to concentrate, able to name objects and repeat phrases. Appropriate fund of knowledge , normal sensation and normal coordination. Mental Status Affect - not angry, not paranoid. Cranial Nerves-Normal Bilaterally. Gait-Normal. Note: Long-term memory fair   Neuropsychiatric Mental status exam performed with findings of-able to articulate well with normal speech/language, rate, volume and coherence, thought content normal with ability to perform basic computations and apply abstract reasoning and no evidence of hallucinations, delusions, obsessions or homicidal/suicidal ideation.  Musculoskeletal Global Assessment Spine, Ribs and Pelvis - no instability, subluxation or laxity. Right Upper Extremity - no instability, subluxation or laxity. Note: Mild scoliosis of the thoracic spine. Not severe. No pain along spine moves joints pretty well   Lymphatic Head & Neck  General Head & Neck Lymphatics: Bilateral - Description - No Localized lymphadenopathy. Axillary  General Axillary Region: Bilateral - Description - No Localized lymphadenopathy. Femoral & Inguinal  Generalized Femoral & Inguinal Lymphatics: Left - Description - No Localized lymphadenopathy. Right - Description - No Localized lymphadenopathy.    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 Cameron ulcers. Refractory to high-dose proton pump inhibitors, ranitidine, Carafate.  I think she would benefit from PEH reduction and repair, but I need the rest of her records to make sure her workup is complete.  Manometry OK  Got copies of her esophagram.  Karl Luke will require pyloroplasty sinceshe truly has delayed gastric emptying which seems to be strongly suspected by gastric emptying study.   Clearance from her cardiologist. She recalls her  verbal clearance by Dr. Daryel November in [place]. We'll try and get records.  Current Plans Pt Education - [facility] Esophageal [facility] Diet HCI (Kamea Dacosta): discussed with Garza and provided information. Pt Education - [facility] Laparoscopic [facility] The anatomy & physiology of the foregut and anti-reflux mechanism was discussed. The pathophysiology of hiatal herniation and GERD was discussed. Natural history risks without surgery was discussed. The Garza's symptoms are not adequately controlled by medicines and other non-operative treatments. I feel the risks of no intervention will lead to serious problems that outweigh the operative risks; therefore, I recommended surgery to reduce the hiatal hernia out of the chest and fundoplication to rebuild the anti-reflux valve and control reflux better. Need  for a thorough workup to rule out the differential diagnosis and plan treatment was explained. I explained laparoscopic techniques with possible need for an open approach.  Risks such as bleeding, infection, abscess, leak, need for further treatment, heart attack, death, and other risks were discussed. I noted a good likelihood this will help address the problem. Goals of post-operative recovery were discussed as well. Possibility that this will not correct all symptoms was explained. Post-operative dysphagia, need for short-term liquid & pureed diet, inability to vomit, possibility of reherniation, possible need for medicines to help control symptoms in addition to surgery were discussed. We will work to minimize complications. Educational handouts further explaining the pathology, treatment options, and dysphagia diet was given as well. Questions were answered. The Garza expresses understanding & wishes to proceed with surgery.  I recommended to the Garza that they have an evaluation of esophageal motility with manometry by endoscopy unit under the supervision of gastroenterology. I recommended  obtaining preoperative cardiac clearance. She was just seen by her cardiologist, Dr. Daryel November, [facility]. She remembers her bili being told she was cleared for surgery. We would like to get those records to be safe. Make sure she doesn't need further workup. Garza and caregiver recall an echocardiogram in the last year. I am concerned about the health of the Garza and the ability to tolerate the operation. Therefore, we will request clearance by cardiology to better assess operative risk & see if a reevaluation, further workup, etc is needed. Also recommendations on how medications such as for anticoagulation and blood pressure should be managed/held/restarted after surgery. DELAYED GASTRIC EMPTYING (K30) Impression: Most likely delayed gastric emptying based on history. Would like to get study.  She truly has significant delayed gastric emptying, so plan concurrent pyloroplasty.    Angelica Garza, M.D., F.A.C.S. Gastrointestinal and Minimally Invasive Surgery [facility] [address], [address] [place], [place] [address] [phone] Main / Paging

## 2016-03-05 NOTE — Transfer of Care (Signed)
Immediate Anesthesia Transfer of Care Note  Patient: Angelica Garza  Procedure(s) Performed: Procedure(s): XI ROBOTIC REPAIR PARAESOPHAGEAL HIATAL HERNIA WITH  mesh, nissen FUNDOPLICATION PYLOROPLASY, omentopexy (N/A)  Patient Location: PACU  Anesthesia Type:General  Level of Consciousness:  sedated, patient cooperative and responds to stimulation  Airway & Oxygen Therapy:Patient Spontanous Breathing and Patient connected to face mask oxgen  Post-op Assessment:  Report given to PACU RN and Post -op Vital signs reviewed and stable  Post vital signs:  Reviewed and stable  Last Vitals:  Vitals:   03/05/16 0615  BP: (!) 128/53  Pulse: 90  Resp: 18  Temp: 37 C    Complications: No apparent anesthesia complications

## 2016-03-05 NOTE — Anesthesia Procedure Notes (Signed)
Procedure Name: Intubation Date/Time: 03/05/2016 8:54 AM Performed by: Tyshauna Finkbiner, Nuala AlphaKRISTOPHER Pre-anesthesia Checklist: Patient identified, Emergency Drugs available, Suction available, Patient being monitored and Timeout performed Patient Re-evaluated:Patient Re-evaluated prior to inductionOxygen Delivery Method: Circle system utilized Preoxygenation: Pre-oxygenation with 100% oxygen Intubation Type: IV induction Ventilation: Mask ventilation without difficulty Laryngoscope Size: Mac and 4 Grade View: Grade II Tube type: Oral Tube size: 7.5 mm Number of attempts: 1 Airway Equipment and Method: Stylet Placement Confirmation: ETT inserted through vocal cords under direct vision,  positive ETCO2,  CO2 detector and breath sounds checked- equal and bilateral Secured at: 21 cm Tube secured with: Tape Dental Injury: Teeth and Oropharynx as per pre-operative assessment

## 2016-03-05 NOTE — Interval H&P Note (Signed)
History and Physical Interval Note:  03/05/2016 8:22 AM  Angelica ElmSylvia A Ashby  has presented today for surgery, with the diagnosis of Paraesophageal hiatal hernia Delayed gastric emptying.  The various methods of treatment have been discussed with the patient and family. After consideration of risks, benefits and other options for treatment, the patient has consented to  Procedure(s): XI ROBOTIC REPAIR PARAESOPHAGEAL HIATAL HERNIA WITH  FUNDOPLICATION PYLOROPLASY (N/A) as a surgical intervention .  The patient's history has been reviewed, patient examined, no change in status, stable for surgery.  I have reviewed the patient's chart and labs.  Questions were answered to the patient's satisfaction.     [name]

## 2016-03-05 NOTE — Progress Notes (Signed)
Home med alert: The patient was mis-using her inhalers PTA.   She reports using Breo Ellipta 1 puff bid. Last filled in June but only good for 6 weeks. Also, max dose is 1 puff daily. AND, Anoro Ellipta 1 puff "as needed". Last filled in April but only good for 6 weeks. She calls it her "rescue inhaler" but this contains long-acting agents and is not effective for acute symptoms. Also, max dose is 1 puff daily.  While admitted we will use Breo Ellipta 1 puff daily. Recommend continuing this at discharge and she should clarify with her PCP. Please order prn bronchodilators if needed.   Charolotte Ekeom Raynette Arras, PharmD, pager [phone]. 03/05/2016,4:13 PM.

## 2016-03-05 NOTE — Anesthesia Preprocedure Evaluation (Signed)
Anesthesia Evaluation  Patient identified by MRN, date of birth, ID band Patient awake    Reviewed: Allergy & Precautions, NPO status , Patient's Chart, lab work & pertinent test results  History of Anesthesia Complications Negative for: history of anesthetic complications  Airway Mallampati: II  TM Distance: >3 FB Neck ROM: Full    Dental  (+) Edentulous Upper, Edentulous Lower   Pulmonary shortness of breath, with exertion and Long-Term Oxygen Therapy, COPD,  COPD inhaler, former smoker,    breath sounds clear to auscultation       Cardiovascular hypertension, Pt. on medications  Rhythm:Regular Rate:Bradycardia     Neuro/Psych  Headaches, neg Seizures negative psych ROS   GI/Hepatic Neg liver ROS, GERD  Medicated,Hiatal hernia   Endo/Other  negative endocrine ROS  Renal/GU negative Renal ROS     Musculoskeletal  (+) Arthritis ,   Abdominal   Peds  Hematology negative hematology ROS (+)   Anesthesia Other Findings   Reproductive/Obstetrics                             Anesthesia Physical Anesthesia Plan  ASA: III  Anesthesia Plan: General   Post-op Pain Management:    Induction: Intravenous  Airway Management Planned: Oral ETT  Additional Equipment: None  Intra-op Plan:   Post-operative Plan: Possible Post-op intubation/ventilation and Extubation in OR  Informed Consent: I have reviewed the patients History and Physical, chart, labs and discussed the procedure including the risks, benefits and alternatives for the proposed anesthesia with the patient or authorized representative who has indicated his/her understanding and acceptance.   Dental advisory given  Plan Discussed with: CRNA and Surgeon  Anesthesia Plan Comments:         Anesthesia Quick Evaluation

## 2016-03-06 ENCOUNTER — Inpatient Hospital Stay (HOSPITAL_COMMUNITY): Payer: Medicare Other

## 2016-03-06 LAB — BASIC METABOLIC PANEL
ANION GAP: 7 (ref 5–15)
BUN: 13 mg/dL (ref 6–20)
CALCIUM: 9.1 mg/dL (ref 8.9–10.3)
CO2: 26 mmol/L (ref 22–32)
Chloride: 101 mmol/L (ref 101–111)
Creatinine, Ser: 0.73 mg/dL (ref 0.44–1.00)
Glucose, Bld: 113 mg/dL — ABNORMAL HIGH (ref 65–99)
Potassium: 4.8 mmol/L (ref 3.5–5.1)
Sodium: 134 mmol/L — ABNORMAL LOW (ref 135–145)

## 2016-03-06 LAB — MAGNESIUM: Magnesium: 1.7 mg/dL (ref 1.7–2.4)

## 2016-03-06 MED ORDER — IOPAMIDOL (ISOVUE-300) INJECTION 61%
50.0000 mL | Freq: Once | INTRAVENOUS | Status: AC | PRN
  Administered 2016-03-06: 70 mL via ORAL

## 2016-03-06 MED ORDER — GABAPENTIN 300 MG PO CAPS
300.0000 mg | ORAL_CAPSULE | Freq: Three times a day (TID) | ORAL | Status: DC
  Administered 2016-03-06 – 2016-03-10 (×14): 300 mg via ORAL
  Filled 2016-03-06 (×14): qty 1

## 2016-03-06 MED ORDER — AMLODIPINE BESYLATE 5 MG PO TABS
5.0000 mg | ORAL_TABLET | Freq: Every day | ORAL | Status: DC
  Administered 2016-03-06 – 2016-03-10 (×5): 5 mg via ORAL
  Filled 2016-03-06 (×5): qty 1

## 2016-03-06 MED ORDER — LOSARTAN POTASSIUM 50 MG PO TABS
100.0000 mg | ORAL_TABLET | Freq: Every day | ORAL | Status: DC
  Administered 2016-03-06 – 2016-03-10 (×5): 100 mg via ORAL
  Filled 2016-03-06 (×5): qty 2

## 2016-03-06 MED ORDER — BUTALBITAL-APAP-CAFFEINE 50-325-40 MG PO TABS: 1.0000 | ORAL_TABLET | Freq: Every day | ORAL | Status: DC | PRN

## 2016-03-06 MED ORDER — PANTOPRAZOLE SODIUM 40 MG PO TBEC
40.0000 mg | DELAYED_RELEASE_TABLET | Freq: Every day | ORAL | Status: DC
  Administered 2016-03-06 – 2016-03-10 (×5): 40 mg via ORAL
  Filled 2016-03-06 (×5): qty 1

## 2016-03-06 MED ORDER — TRAZODONE HCL 50 MG PO TABS
100.0000 mg | ORAL_TABLET | Freq: Every day | ORAL | Status: DC
  Administered 2016-03-07 – 2016-03-09 (×4): 100 mg via ORAL
  Filled 2016-03-06 (×4): qty 2

## 2016-03-06 MED ORDER — METOCLOPRAMIDE HCL 5 MG PO TABS: 5.0000 mg | ORAL_TABLET | Freq: Four times a day (QID) | ORAL | Status: DC | PRN

## 2016-03-06 MED ORDER — CYANOCOBALAMIN 1000 MCG/ML IJ SOLN: 1000.0000 ug | INTRAMUSCULAR | Status: DC

## 2016-03-06 MED ORDER — PRIMIDONE 50 MG PO TABS
50.0000 mg | ORAL_TABLET | Freq: Every day | ORAL | Status: DC
  Administered 2016-03-07 – 2016-03-09 (×4): 50 mg via ORAL
  Filled 2016-03-06 (×5): qty 1

## 2016-03-06 MED ORDER — GUAIFENESIN-CODEINE 100-10 MG/5ML PO SOLN: 5.0000 mL | Freq: Three times a day (TID) | ORAL | Status: DC | PRN

## 2016-03-06 NOTE — Progress Notes (Signed)
Ng tube removed post UGI and diet increased to pureed per Dr Michaell CowingGross

## 2016-03-06 NOTE — Progress Notes (Signed)
[facility]  [address] [place], [place] [address] Phone: [phone] FAX: [phone]   Angelica Garza [id number] [birth year]  CARE TEAM:  PCP: Marjo Bicker, MD  Outpatient Care Team: Patient Care Team: Marjo Bicker, MD as PCP - General (Internal Medicine) Rogene Houston, MD as Consulting Physician (Gastroenterology) Roselind Messier as Consulting Physician (Otolaryngology) Orpah Greek, MD as Referring Physician (Cardiology) Michael Boston, MD as Consulting Physician (General Surgery)  Inpatient Treatment Team: Treatment Team: Attending Provider: Michael Boston, MD; Technician: Leda Quail, NT  Problem List:   Principal Problem:   Incarcerated paraesophageal hernia s/p robotic repair 03/05/2016 Active Problems:   Chronic Lysbeth Galas ulcer   Delayed gastric emptying s/p pyloroplasty 03/05/2016   Degenerative arthritis   Constipation   1 Day Post-Op  03/05/2016  POST-OPERATIVE DIAGNOSIS:  paraesophageal hiatal hernia delayed gastric emptying  PROCEDURE:    1. Robotic reduction of paraesophageal hiatal hernia 2. Type II mediastinal dissection. 3. Primary repair of hiatal hernia over pledgets. 4. Reinforcement of hiatal hernia repair with absorbable mesh (Phasix 10x15cm) 5. Anterior & posterior gastropexy. 6. Nissen fundoplication 2 cm over a 56-French bougie 7. Pyloroplasty 8. Omentopexy   SURGEON:  Michael Boston, MD  ASSIST: Ralene Ok, MD - Assist Olene Floss, PA-S, [place]   Assessment  Recovering status post hiatal hernia repair with fundoplication and pyloroplasty   Plan:  Upper GI shows no obstruction or obvious leak.  Nasogastric tube removed. -Advance diet: liquids to pureed diet  -D/c foley -Continue pain control -VTE prophylaxis- SCDs, etc -Mobilize as tolerated to help recovery   Spoke with Kerby Moors, MD regarding this morning's UGI series, as his  assessment did not specify passage of contrast through pyloric sphincter.  He will re-examine patient to assess post-pyloroplasty function and fortitude.  Estimated length of stay dependant on patient's rate of recovery. This includes but is not limited to: pain control, food toleration, adequate urination and bowel movement, flatus, and sufficient mobility.  03/06/2016   Subjective:  Patient currently states pain controlled "at the moment." Tolerated NG tube at time of interaction, has since been removed. Denies flatus or bowel movement.   Objective:  Vital signs:  Vitals:   03/05/16 1826 03/05/16 2042 03/06/16 0215 03/06/16 0500  BP: 103/62 (!) 170/70 (!) 165/55 126/89  Pulse: 76 75 80   Resp: 15 16 14    Temp: 97.8 F (36.6 C) 97.6 F (36.4 C) 98.3 F (36.8 C) 98.2 F (36.8 C)  TempSrc: Oral Oral Oral Oral  SpO2: 93% 95% 96% 96%  Weight:      Height:        Last BM Date: 03/04/16  Intake/Output   Yesterday:  09/13 0701 - 09/14 0700 In: 2770.8 [P.O.:120; I.V.:2490.8; IV Piggyback:160] Out: 2020 [Urine:1380; Emesis/NG output:350; Drains:190; Blood:100] This shift:  Total I/O In: -  Out: 240 [Urine:200; Drains:40]  Bowel function:  Flatus: No  BM:  No  Drain: serosanguinous   Physical Exam:  General: Pt awake/alert/oriented x4 in No acute distress Eyes: PERRL, normal EOM.  Sclera clear.  No icterus Neuro: CN II-XII intact w/o focal sensory/motor deficits. Lymph: No head/neck/groin lymphadenopathy Psych:  No delerium/psychosis/paranoia HENT: Normocephalic, Mucus membranes moist.  No thrush Neck: Supple, No tracheal deviation Chest: No chest wall pain w good excursion CV:  Pulses intact.  Regular rhythm MS: Normal AROM mjr joints.  No obvious deformity Abdomen: Soft.  Nondistended.  Mildly tender at incisions only.  No evidence  of peritonitis.  No incarcerated hernias. Ext:  SCDs BLE.  No mjr edema.  No cyanosis Skin: No petechiae / purpura  Results:    Labs: Results for orders placed or performed during the hospital encounter of 03/05/16 (from the past 48 hour(s))  Basic metabolic panel     Status: Abnormal   Collection Time: 03/06/16  4:35 AM  Result Value Ref Range   Sodium 134 (L) 135 - 145 mmol/L   Potassium 4.8 3.5 - 5.1 mmol/L   Chloride 101 101 - 111 mmol/L   CO2 26 22 - 32 mmol/L   Glucose, Bld 113 (H) 65 - 99 mg/dL   BUN 13 6 - 20 mg/dL   Creatinine, Ser 0.73 0.44 - 1.00 mg/dL   Calcium 9.1 8.9 - 10.3 mg/dL   GFR calc non Af Amer >60 >60 mL/min   GFR calc Af Amer >60 >60 mL/min    Comment: (NOTE) The eGFR has been calculated using the CKD EPI equation. This calculation has not been validated in all clinical situations. eGFR's persistently <60 mL/min signify possible Chronic Kidney Disease.    Anion gap 7 5 - 15  Magnesium     Status: None   Collection Time: 03/06/16  4:35 AM  Result Value Ref Range   Magnesium 1.7 1.7 - 2.4 mg/dL    Imaging / Studies: Dg Ugi W/water Sol Cm  Result Date: 03/06/2016 CLINICAL DATA:  Status post hiatal hernia repair EXAM: WATER SOLUBLE UPPER GI SERIES TECHNIQUE: Single-column upper GI series was performed using water soluble contrast. CONTRAST:  24m ISOVUE-300 IOPAMIDOL (ISOVUE-300) INJECTION 61% COMPARISON:  None. FLUOROSCOPY TIME:  Radiation Exposure Index (if provided by the fluoroscopic device): 11.3 mGy FINDINGS: On the scout radiograph there is nasogastric tube in place. Surgical drain overlies the left upper quadrant of the abdomen. The bowel gas pattern appears nonobstructed. The patient ingested 25 cc of water-soluble contrast material. This mostly accumulates within the distal esophagus around the nasogastric tube at the level of the wrap. A small amount of contrast material passes beyond the fundoplication wrap into the gastric lumen along the nasogastric tube. On the delayed images there is passage of contrast material into the proximal small bowel loops. No extravasation of  contrast material identified. IMPRESSION: 1. Postoperative changes compatible with fundoplication wrap. A small amount of contrast material passed along the nasogastric tube into the lumen of the stomach without extravasation of contrast. Electronically Signed   By: TKerby MoorsM.D.   On: 03/06/2016 09:23    Medications / Allergies: per chart  Antibiotics: Anti-infectives    Start     Dose/Rate Route Frequency Ordered Stop   03/05/16 1700  metroNIDAZOLE (FLAGYL) IVPB 500 mg     500 mg 100 mL/hr over 60 Minutes Intravenous Every 8 hours 03/05/16 1448 03/05/16 1823   03/05/16 1600  ceFAZolin (ANCEF) IVPB 2g/100 mL premix     2 g 200 mL/hr over 30 Minutes Intravenous Every 8 hours 03/05/16 1448 03/05/16 1617   03/05/16 0618  ceFAZolin (ANCEF) IVPB 2g/100 mL premix     2 g 200 mL/hr over 30 Minutes Intravenous On call to O.R. 03/05/16 0599309/13/17 05701  03/05/16 0618  metroNIDAZOLE (FLAGYL) IVPB 500 mg     500 mg 100 mL/hr over 60 Minutes Intravenous On call to O.R. 03/05/16 0779309/13/17 09030       Note: Portions of this report may have been transcribed using voice recognition software. Every effort was made to ensure  accuracy; however, inadvertent computerized transcription errors may be present.   Any transcriptional errors that result from this process are unintentional.    Olene Floss, PA-S, [facility]   @SCGSIGNATURE @  03/06/2016

## 2016-03-06 NOTE — Anesthesia Postprocedure Evaluation (Signed)
Anesthesia Post Note  Patient: Angelica Garza  Procedure(s) Performed: Procedure(s) (LRB): XI ROBOTIC REPAIR PARAESOPHAGEAL HIATAL HERNIA WITH  mesh, nissen FUNDOPLICATION PYLOROPLASY, omentopexy (N/A)  Patient location during evaluation: PACU Anesthesia Type: General Level of consciousness: awake Pain management: pain level controlled Vital Signs Assessment: post-procedure vital signs reviewed and stable Respiratory status: spontaneous breathing Cardiovascular status: stable Postop Assessment: no signs of nausea or vomiting Anesthetic complications: no    Last Vitals:  Vitals:   03/06/16 0500 03/06/16 1304  BP: 126/89 (!) 147/78  Pulse:    Resp:    Temp: 36.8 C     Last Pain:  Vitals:   03/06/16 1254  TempSrc:   PainSc: 10-Worst pain ever                 Donald Jacque

## 2016-03-06 NOTE — Plan of Care (Signed)
Problem: Food- and Nutrition-Related Knowledge Deficit (NB-1.1) Goal: Nutrition education Formal process to instruct or train a patient/client in a skill or to impart knowledge to help patients/clients voluntarily manage or modify food choices and eating behavior to maintain or improve health. Outcome: Completed/Met Date Met: 03/06/16          Nutrition Note  RD provided patient with Pureed Diet Handout from [employer]. Patient was educated on foods recommended and not recommended for pureed diet, how to utilize juice, milk, and broths to thicken foods to a pudding consistency and to remain on diet for 2-3 weeks.  Expected compliance is fair. Current BMI is 29.5, overweight Current meal completion is unknown.  Satira Anis. Harvie Morua, MS, RD LDN Inpatient Clinical Dietitian Pager [phone]

## 2016-03-07 ENCOUNTER — Inpatient Hospital Stay (HOSPITAL_COMMUNITY): Payer: Medicare Other

## 2016-03-07 LAB — CBC
HCT: 34.3 % — ABNORMAL LOW (ref 36.0–46.0)
HEMOGLOBIN: 11 g/dL — AB (ref 12.0–15.0)
MCH: 28.8 pg (ref 26.0–34.0)
MCHC: 32.1 g/dL (ref 30.0–36.0)
MCV: 89.8 fL (ref 78.0–100.0)
Platelets: 319 10*3/uL (ref 150–400)
RBC: 3.82 MIL/uL — AB (ref 3.87–5.11)
RDW: 13.1 % (ref 11.5–15.5)
WBC: 12.7 10*3/uL — ABNORMAL HIGH (ref 4.0–10.5)

## 2016-03-07 LAB — BASIC METABOLIC PANEL
ANION GAP: 6 (ref 5–15)
BUN: 14 mg/dL (ref 6–20)
CALCIUM: 8.8 mg/dL — AB (ref 8.9–10.3)
CO2: 28 mmol/L (ref 22–32)
Chloride: 98 mmol/L — ABNORMAL LOW (ref 101–111)
Creatinine, Ser: 0.79 mg/dL (ref 0.44–1.00)
GLUCOSE: 139 mg/dL — AB (ref 65–99)
Potassium: 4.3 mmol/L (ref 3.5–5.1)
SODIUM: 132 mmol/L — AB (ref 135–145)

## 2016-03-07 MED ORDER — VERAPAMIL HCL ER 180 MG PO TBCR
180.0000 mg | EXTENDED_RELEASE_TABLET | Freq: Every day | ORAL | Status: DC
  Filled 2016-03-07 (×3): qty 1

## 2016-03-07 MED ORDER — OXYCODONE HCL 5 MG PO TABS
5.0000 mg | ORAL_TABLET | ORAL | Status: DC | PRN
  Administered 2016-03-08 – 2016-03-10 (×5): 5 mg via ORAL
  Filled 2016-03-07 (×5): qty 1

## 2016-03-07 MED ORDER — HYDROMORPHONE HCL 1 MG/ML IJ SOLN
0.5000 mg | INTRAMUSCULAR | Status: DC | PRN
  Administered 2016-03-07 (×3): 1 mg via INTRAVENOUS
  Administered 2016-03-08: 2 mg via INTRAVENOUS
  Filled 2016-03-07 (×3): qty 1
  Filled 2016-03-07: qty 2

## 2016-03-07 MED ORDER — FUROSEMIDE 10 MG/ML IJ SOLN
40.0000 mg | Freq: Once | INTRAMUSCULAR | Status: AC
  Administered 2016-03-07: 40 mg via INTRAVENOUS
  Filled 2016-03-07: qty 4

## 2016-03-07 MED ORDER — ACETAMINOPHEN 500 MG PO TABS
1000.0000 mg | ORAL_TABLET | Freq: Three times a day (TID) | ORAL | Status: DC
  Administered 2016-03-07 – 2016-03-10 (×13): 1000 mg via ORAL
  Filled 2016-03-07 (×14): qty 2

## 2016-03-07 NOTE — Progress Notes (Signed)
Pt with elevated HR on routine vital check. No complaints of pain or SOB.  EKG showed atrial flutter.  Md on call notified, orders given for cardiac monitoring, EKG, chest x-ray , and EKG in am.  Will transfer to Telemetry when bed available.  Darick Fetters RN

## 2016-03-07 NOTE — Progress Notes (Addendum)
Surgery:  Called by nursing for tachycardia. No CP or dyspnea reported.Borderline hypertension. Two days post op PEH repair. Swallow looks OK yesterday. SpO2 93% EKG shows regular tachycardia at 150-170 looks like atrial flutter Patient [place] in [place] Miller),  Takes amlodipine, losartin, verapamil as O.P.   Plan: Lopressor 5 mg q6h prn HR> 110 Restart verapamil, losartin,amlodipine---discussed with R.N. CBC,Bmet, now Cardiac enzymes and repeat in 8 hr CXR EKG tomorrow Cardiology consult    Angelia MouldHaywood M. Derrell LollingIngram, M.D., [facility] LPFACS [facility] General and Minimally invasive Surgery Breast and Colorectal Surgery Office:   [phone] Pager:   [phone] Mobile: [phone]

## 2016-03-07 NOTE — Progress Notes (Signed)
[name] [place], [place] [address] Phone: [phone] FAX: [phone]   Angelica Garza [id number] [birth year]  CARE TEAM:  PCP: Marjo Bicker, MD  Outpatient Care Team: Patient Care Team: Marjo Bicker, MD as PCP - General (Internal Medicine) Rogene Houston, MD as Consulting Physician (Gastroenterology) Roselind Messier as Consulting Physician (Otolaryngology) Orpah Greek, MD as Referring Physician (Cardiology) Michael Boston, MD as Consulting Physician (General Surgery)  Inpatient Treatment Team: Treatment Team: Attending Provider: Michael Boston, MD; Registered Nurse: Arnold Long, RN  Problem List:   Principal Problem:   Incarcerated paraesophageal hernia s/p robotic repair 03/05/2016 Active Problems:   Essential hypertension   Chronic Lysbeth Galas ulcer   Delayed gastric emptying s/p pyloroplasty 03/05/2016   Degenerative arthritis   Constipation   2 Days Post-Op  03/05/2016  POST-OPERATIVE DIAGNOSIS:  paraesophageal hiatal hernia delayed gastric emptying  PROCEDURE:    1. Robotic reduction of paraesophageal hiatal hernia 2. Type II mediastinal dissection. 3. Primary repair of hiatal hernia over pledgets. 4. Reinforcement of hiatal hernia repair with absorbable mesh (Phasix 10x15cm) 5. Anterior & posterior gastropexy. 6. Nissen fundoplication 2 cm over a 56-French bougie 7. Pyloroplasty 8. Omentopexy   SURGEON:  Michael Boston, MD  ASSIST: Ralene Ok, MD - Assist Olene Floss, PA-S, [place]   Assessment  Recovering status post hiatal hernia repair with fundoplication and pyloroplasty   Plan:  -Upper GI shows no obstruction or obvious leak.  Nasogastric tube removed. -Advance diet: pureed diet  -wean IVF -diuresis -D/c drain -Inc PO pain control -VTE prophylaxis- SCDs, etc -Mobilize as tolerated to help recovery - get her out of the room - d/w CNA/RNs.   Have PT/OT eval ?need for [facility] / SNF?   D/C patient from hospital when patient meets criteria (anticipate in 1-2 day(s)):  Tolerating oral intake well Ambulating well Adequate pain control without IV medications Urinating  Having flatus Disposition planning in place   03/07/2016   Subjective:  Tol liquids Sore Staying in bed - going to bathroom only Wants oxygen (sats 93% on room air)  Objective:  Vital signs:  Vitals:   03/06/16 0750 03/06/16 1304 03/06/16 2131 03/07/16 0640  BP:  (!) 147/78 (!) 134/56 103/71  Pulse:  70 67 78  Resp:   16 16  Temp:  98.6 F (37 C) 99.2 F (37.3 C) 98.1 F (36.7 C)  TempSrc:  Oral Oral Oral  SpO2: 98% 98% 98% 93%  Weight:      Height:        Last BM Date: 03/04/16  Intake/Output   Yesterday:  09/14 0701 - 09/15 0700 In: 1870 [P.O.:720; I.V.:1150] Out: 24 [Urine:650; Drains:210] This shift:  Total I/O In: -  Out: 10 [Drains:10]  Bowel function:  Flatus: YES  BM:  No  Drain: serosanguinous   Physical Exam:  General: Pt awake/alert/oriented x4 in No acute distress Eyes: PERRL, normal EOM.  Sclera clear.  No icterus Neuro: CN II-XII intact w/o focal sensory/motor deficits. Lymph: No head/neck/groin lymphadenopathy Psych:  No delerium/psychosis/paranoia HENT: Normocephalic, Mucus membranes moist.  No thrush Neck: Supple, No tracheal deviation Chest: No chest wall pain w good excursion CV:  Pulses intact.  Regular rhythm MS: Normal AROM mjr joints.  No obvious deformity Abdomen: Soft.  Nondistended.  Tenderness at epigastric region only.  No evidence of peritonitis.  No incarcerated hernias. Ext:  SCDs BLE.  No mjr edema.  No cyanosis Skin: No  petechiae / purpura  Results:   Labs: Results for orders placed or performed during the hospital encounter of 03/05/16 (from the past 48 hour(s))  Basic metabolic panel     Status: Abnormal   Collection Time: 03/06/16  4:35 AM  Result Value Ref Range   Sodium 134 (L)  135 - 145 mmol/L   Potassium 4.8 3.5 - 5.1 mmol/L   Chloride 101 101 - 111 mmol/L   CO2 26 22 - 32 mmol/L   Glucose, Bld 113 (H) 65 - 99 mg/dL   BUN 13 6 - 20 mg/dL   Creatinine, Ser 0.73 0.44 - 1.00 mg/dL   Calcium 9.1 8.9 - 10.3 mg/dL   GFR calc non Af Amer >60 >60 mL/min   GFR calc Af Amer >60 >60 mL/min    Comment: (NOTE) The eGFR has been calculated using the CKD EPI equation. This calculation has not been validated in all clinical situations. eGFR's persistently <60 mL/min signify possible Chronic Kidney Disease.    Anion gap 7 5 - 15  Magnesium     Status: None   Collection Time: 03/06/16  4:35 AM  Result Value Ref Range   Magnesium 1.7 1.7 - 2.4 mg/dL    Imaging / Studies: Dg Ugi W/water Sol Cm  Result Date: 03/06/2016 CLINICAL DATA:  Status post hiatal hernia repair EXAM: WATER SOLUBLE UPPER GI SERIES TECHNIQUE: Single-column upper GI series was performed using water soluble contrast. CONTRAST:  46m ISOVUE-300 IOPAMIDOL (ISOVUE-300) INJECTION 61% COMPARISON:  None. FLUOROSCOPY TIME:  Radiation Exposure Index (if provided by the fluoroscopic device): 11.3 mGy FINDINGS: On the scout radiograph there is nasogastric tube in place. Surgical drain overlies the left upper quadrant of the abdomen. The bowel gas pattern appears nonobstructed. The patient ingested 25 cc of water-soluble contrast material. This mostly accumulates within the distal esophagus around the nasogastric tube at the level of the wrap. A small amount of contrast material passes beyond the fundoplication wrap into the gastric lumen along the nasogastric tube. On the delayed images there is passage of contrast material into the proximal small bowel loops. No extravasation of contrast material identified. IMPRESSION: 1. Postoperative changes compatible with fundoplication wrap. A small amount of contrast material passed along the nasogastric tube into the lumen of the stomach without extravasation of contrast.  Electronically Signed   By: TKerby MoorsM.D.   On: 03/06/2016 09:23    Medications / Allergies: per chart  Antibiotics: Anti-infectives    Start     Dose/Rate Route Frequency Ordered Stop   03/05/16 1700  metroNIDAZOLE (FLAGYL) IVPB 500 mg     500 mg 100 mL/hr over 60 Minutes Intravenous Every 8 hours 03/05/16 1448 03/05/16 1823   03/05/16 1600  ceFAZolin (ANCEF) IVPB 2g/100 mL premix     2 g 200 mL/hr over 30 Minutes Intravenous Every 8 hours 03/05/16 1448 03/05/16 1617   03/05/16 0618  ceFAZolin (ANCEF) IVPB 2g/100 mL premix     2 g 200 mL/hr over 30 Minutes Intravenous On call to O.R. 03/05/16 0789309/13/17 08101  03/05/16 0618  metroNIDAZOLE (FLAGYL) IVPB 500 mg     500 mg 100 mL/hr over 60 Minutes Intravenous On call to O.R. 03/05/16 0751009/13/17 02585       Note: Portions of this report may have been transcribed using voice recognition software. Every effort was made to ensure accuracy; however, inadvertent computerized transcription errors may be present.   Any transcriptional errors that result from this process  are unintentional.   Adin Hector, M.D., F.A.C.S. Gastrointestinal and Minimally Invasive Surgery [facility] [address] [place], [place] [address] [phone] Main / Paging    03/07/2016

## 2016-03-07 NOTE — Progress Notes (Signed)
Chest x-ray results called to Dr.Thomas.  No additional orders given at this time  Angelica Neff Rn

## 2016-03-07 NOTE — Progress Notes (Signed)
PT Cancellation Note  Patient Details Name: Angelica KerbsSylvia A Forbis MRN: [id number] DOB: July 19, 1945   Cancelled Treatment:    Reason Eval/Treat Not Completed: Medical issues which prohibited therapy (elevated HR and abn . EKG. will check back tomorrow,.)   Rada HayHill, Maisey Deandrade [place] 03/07/2016, 3:05 PM Blanchard KelchKaren Miriam Kestler PT [phone]

## 2016-03-08 DIAGNOSIS — Z01810 Encounter for preprocedural cardiovascular examination: Secondary | ICD-10-CM

## 2016-03-08 DIAGNOSIS — Z5309 Procedure and treatment not carried out because of other contraindication: Secondary | ICD-10-CM

## 2016-03-08 DIAGNOSIS — I480 Paroxysmal atrial fibrillation: Secondary | ICD-10-CM

## 2016-03-08 DIAGNOSIS — I483 Typical atrial flutter: Secondary | ICD-10-CM

## 2016-03-08 DIAGNOSIS — I10 Essential (primary) hypertension: Secondary | ICD-10-CM

## 2016-03-08 DIAGNOSIS — R000 Tachycardia, unspecified: Secondary | ICD-10-CM

## 2016-03-08 MED ORDER — HYDROMORPHONE HCL 1 MG/ML IJ SOLN: 0.5000 mg | INTRAMUSCULAR | Status: DC | PRN

## 2016-03-08 MED ORDER — METHOCARBAMOL 500 MG PO TABS: 500.0000 mg | ORAL_TABLET | Freq: Three times a day (TID) | ORAL | Status: DC | PRN

## 2016-03-08 MED ORDER — VERAPAMIL HCL ER 240 MG PO TBCR
240.0000 mg | EXTENDED_RELEASE_TABLET | Freq: Every day | ORAL | Status: DC
  Administered 2016-03-09 – 2016-03-10 (×2): 240 mg via ORAL
  Filled 2016-03-08 (×2): qty 1

## 2016-03-08 NOTE — Evaluation (Signed)
Physical Therapy Evaluation Patient Details Name: Angelica KerbsSylvia A Garza MRN: [id number] DOB: [birth year] Today's Date: 03/08/2016   History of Present Illness  The patient is a 70 year old female with h/o hypertension, who presented 03/05/16  with a paraesophageal hiatal hernia, s/p robotic laparoscopic Nissen fundoplication on 03/05/2016. While in the hospital she has developed 1 episode of a very short a-flutter short episodes of atrial fibrillation with RVR,  Clinical Impression  The patient had incontinence in bed, [facility]. The patient is home alone nights. She repors that she has a personal care giver 12hours /day. No family present to verify. She may benefit from SNF/rehab after discharge. Pt admitted with above diagnosis. Pt currently with functional limitations due to the deficits listed below (see PT Problem List). Pt will benefit from skilled PT to increase their independence and safety with mobility to allow discharge to the venue listed below.       Follow Up Recommendations SNF;Supervision/Assistance - 24 hour    Equipment Recommendations  None recommended by PT    Recommendations for Other Services       Precautions / Restrictions Precautions Precaution Comments: on oxygen, BM urgency      Mobility  Bed Mobility Overal bed mobility: Needs Assistance Bed Mobility: Supine to Sit;Sit to Supine;Rolling Rolling: Independent   Supine to sit: Independent Sit to supine: Independent   General bed mobility comments: quickly got herself to sitting and standing  Transfers Overall transfer level: Needs assistance Equipment used: 4-wheeled walker Transfers: Sit to/from Stand Sit to Stand: Min assist         General transfer comment: relies on Upper extremities on bed ,on RW,  on rails in bathroom for sit to stand to sit for stability  Ambulation/Gait Ambulation/Gait assistance: Min assist Ambulation Distance (Feet): 10 Feet (x 2) Assistive  device: 4-wheeled walker Gait Pattern/deviations: Step-to pattern     General Gait Details: cues for safety due to moving quickly due to need for BM.  Stairs            Wheelchair Mobility    Modified Rankin (Stroke Patients Only)       Balance Overall balance assessment: History of Falls;Needs assistance Sitting-balance support: Bilateral upper extremity supported                                         Pertinent Vitals/Pain Pain Assessment: No/denies pain    Home Living Family/patient expects to be discharged to:: Private residence   Available Help at Discharge: Personal care attendant Type of Home: Apartment Home Access: Level entry;Elevator     Home Layout: One level Home Equipment: Walker - 4 wheels Additional Comments: Has a  CNA 12 hours /day.    Prior Function Level of Independence: Needs assistance   Gait / Transfers Assistance Needed: uses 4 wheeled RW  ADL's / Homemaking Assistance Needed: PCA assists        Hand Dominance        Extremity/Trunk Assessment   Upper Extremity Assessment: Defer to OT evaluation           Lower Extremity Assessment: Generalized weakness      Cervical / Trunk Assessment: Normal  Communication      Cognition Arousal/Alertness: Awake/alert Behavior During Therapy: WFL for tasks assessed/performed;Impulsive Overall Cognitive Status: Within Functional Limits for tasks assessed  General Comments      Exercises     Assessment/Plan    PT Assessment Patient needs continued PT services  PT Problem List Decreased strength;Decreased activity tolerance;Decreased balance;Decreased mobility;Decreased safety awareness;Decreased knowledge of use of DME;Decreased knowledge of precautions          PT Treatment Interventions DME instruction;Gait training;Functional mobility training;Therapeutic activities;Therapeutic exercise;Patient/family education    PT Goals  (Current goals can be found in the Care Plan section)  Acute Rehab PT Goals Patient Stated Goal: I may need to go for therapy before I go home PT Goal Formulation: With patient Time For Goal Achievement: 03/22/16 Potential to Achieve Goals: Good    Frequency Min 3X/week   Barriers to discharge Decreased caregiver support      Co-evaluation               End of Session   Activity Tolerance: Patient limited by fatigue Patient left: in bed;with call bell/phone within reach;with bed alarm set Nurse Communication: Mobility status         Time: 1430-1453 PT Time Calculation (min) (ACUTE ONLY): 23 min   Charges:   PT Evaluation $PT Eval Low Complexity: 1 Procedure PT Treatments $Gait Training: 8-22 mins   PT G Codes:        Rada Hay 03/08/2016, 3:31 PM  Blanchard Kelch PT [phone]

## 2016-03-08 NOTE — Progress Notes (Signed)
3 Days Post-Op  Subjective: Feels ok, some nausea overnight, up a little yesterday, still on oxygen  Objective: Vital signs in last 24 hours: Temp:  [98.1 F (36.7 C)-98.9 F (37.2 C)] 98.1 F (36.7 C) (09/16 0543) Pulse Rate:  [74-144] 136 (09/16 0543) Resp:  [15-20] 20 (09/15 2054) BP: (145-174)/(67-88) 145/83 (09/16 0543) SpO2:  [92 %-99 %] 92 % (09/16 0543) Last BM Date: 03/07/16  Intake/Output from previous day: 09/15 0701 - 09/16 0700 In: -  Out: 160 [Urine:150; Drains:10] Intake/Output this shift: No intake/output data recorded.  General appearance: no distress Resp: decreased breath sounds bilaterally Cardio: regular rate and rhythm GI: soft some bs approp tender  Lab Results:   Recent Labs  03/07/16 1606  WBC 12.7*  HGB 11.0*  HCT 34.3*  PLT 319   BMET  Recent Labs  03/06/16 0435 03/07/16 1606  NA 134* 132*  K 4.8 4.3  CL 101 98*  CO2 26 28  GLUCOSE 113* 139*  BUN 13 14  CREATININE 0.73 0.79  CALCIUM 9.1 8.8*   PT/INR No results for input(s): LABPROT, INR in the last 72 hours. ABG No results for input(s): PHART, HCO3 in the last 72 hours.  Invalid input(s): PCO2, PO2  Studies/Results: Dg Chest Port 1 View  Result Date: 03/07/2016 CLINICAL DATA:  70 year old presenting with acute onset of cough which began today. Postop day 2 after repair of paraesophageal hiatal hernia and Nissen fundoplication. EXAM: PORTABLE CHEST 1 VIEW COMPARISON:  None. FINDINGS: Cardiac silhouette upper normal in size to slightly enlarged for AP portable technique. Pulmonary vascularity normal. Bilateral pleural effusions and associated mild consolidation in the lower lobes. Lungs otherwise clear. IMPRESSION: 1. Small bilateral pleural effusions and associated mild passive atelectasis and/or pneumonia involving the lower lobes. 2. Borderline heart size without evidence of pulmonary edema. Electronically Signed   By: Hulan Saas M.D.   On: 03/07/2016 15:55   Dg Kayleen Memos  W/water Sol Cm  Result Date: 03/06/2016 CLINICAL DATA:  Status post hiatal hernia repair EXAM: WATER SOLUBLE UPPER GI SERIES TECHNIQUE: Single-column upper GI series was performed using water soluble contrast. CONTRAST:  50mL ISOVUE-300 IOPAMIDOL (ISOVUE-300) INJECTION 61% COMPARISON:  None. FLUOROSCOPY TIME:  Radiation Exposure Index (if provided by the fluoroscopic device): 11.3 mGy FINDINGS: On the scout radiograph there is nasogastric tube in place. Surgical drain overlies the left upper quadrant of the abdomen. The bowel gas pattern appears nonobstructed. The patient ingested 25 cc of water-soluble contrast material. This mostly accumulates within the distal esophagus around the nasogastric tube at the level of the wrap. A small amount of contrast material passes beyond the fundoplication wrap into the gastric lumen along the nasogastric tube. On the delayed images there is passage of contrast material into the proximal small bowel loops. No extravasation of contrast material identified. IMPRESSION: 1. Postoperative changes compatible with fundoplication wrap. A small amount of contrast material passed along the nasogastric tube into the lumen of the stomach without extravasation of contrast. Electronically Signed   By: Signa Kell M.D.   On: 03/06/2016 09:23    Anti-infectives: Anti-infectives    Start     Dose/Rate Route Frequency Ordered Stop   03/05/16 1700  metroNIDAZOLE (FLAGYL) IVPB 500 mg     500 mg 100 mL/hr over 60 Minutes Intravenous Every 8 hours 03/05/16 1448 03/05/16 1823   03/05/16 1600  ceFAZolin (ANCEF) IVPB 2g/100 mL premix     2 g 200 mL/hr over 30 Minutes Intravenous Every 8 hours 03/05/16  1448 03/05/16 1617   03/05/16 0618  ceFAZolin (ANCEF) IVPB 2g/100 mL premix     2 g 200 mL/hr over 30 Minutes Intravenous On call to O.R. 03/05/16 [id number] 03/05/16 [id number]   03/05/16 0618  metroNIDAZOLE (FLAGYL) IVPB 500 mg     500 mg 100 mL/hr over 60 Minutes Intravenous On call to O.R.  03/05/16 [id number] 03/05/16 0842      Assessment/Plan: POD 3 hh repair/Nissen  1. Continue current pain control 2. Continue pureed diet, swallow was fine, ng out 3. Cardiac- I think she has been in/out afib it appears, Dr Derrell LollingIngram had written he was going to consult cards yesterday but dont see this, I will call this am, this has happened before to her.  She is in sinus this am. 4. pulm toilet 5. lovenox scds   Jachin Coury 03/08/2016

## 2016-03-08 NOTE — Progress Notes (Signed)
Patient was medicated with prn lopressor for HR of 136 at 0536. Upon assessment , patient was asymptomatic.  Reported later by central monitoring that patient was in Afib for two hrs before converting to sinus rhythm . Patient HR at this time is 3766.

## 2016-03-08 NOTE — Progress Notes (Signed)
Report given by ongoing nurse. Agreed with nurse assessment of patient and will cont to monitor.  

## 2016-03-08 NOTE — Evaluation (Signed)
Occupational Therapy Evaluation Patient Details Name: Angelica KerbsSylvia A Vanhoesen MRN: [id number] DOB: August 20, 1945 Today's Date: 03/08/2016    History of Present Illness The patient is a 70 year old female with h/o hypertension, who presented 03/05/16  with a paraesophageal hiatal hernia, s/p robotic laparoscopic Nissen fundoplication on 03/05/2016. While in the hospital she has developed 1 episode of a very short a-flutter with 2:1 block and 3 very short episodes of atrial fibrillation with RVR.  PMH includes:  cerebellar degeneration, COPD, cor pulmonale, h/o falls   Clinical Impression   Pt admitted with above. She demonstrates the below listed deficits and will benefit from continued OT to maximize safety and independence with BADLs.  Pt presents to OT with impaired balance, ataxia, generalized weakness, decreased activity tolerance.  She requires min A for ADLs and has h/o frequent falls.  She lives alone.  Discussed SNF level rehab with her, and she is agreeable.  Will follow acutely.       Follow Up Recommendations  Supervision/Assistance - 24 hour    Equipment Recommendations  None recommended by OT    Recommendations for Other Services       Precautions / Restrictions Precautions Precautions: Fall Precaution Comments: on oxygen, BM urgency Restrictions Weight Bearing Restrictions: No      Mobility Bed Mobility Overal bed mobility: Needs Assistance Bed Mobility: Supine to Sit;Sit to Supine Rolling: Supervision   Supine to sit: Supervision Sit to supine: Independent   General bed mobility comments: supervision due to ataxia   Transfers Overall transfer level: Needs assistance Equipment used: 4-wheeled walker Transfers: Sit to/from UGI CorporationStand;Stand Pivot Transfers Sit to Stand: Min assist Stand pivot transfers: Min assist       General transfer comment: min A due to impaired balance     Balance Overall balance assessment: Needs assistance Sitting-balance support: Feet  supported Sitting balance-Leahy Scale: Fair Sitting balance - Comments: Pt loses balance posteriorly when performing LB ADLs, but is able to recover without assist  Postural control: Posterior lean Standing balance support: During functional activity;Bilateral upper extremity supported Standing balance-Leahy Scale: Poor Standing balance comment: Requires min A and bil. UE support                             ADL Overall ADL's : Needs assistance/impaired Eating/Feeding: Modified independent;Bed level   Grooming: Wash/dry hands;Wash/dry face;Oral care;Minimal assistance;Standing   Upper Body Bathing: Set up;Sitting   Lower Body Bathing: Minimal assistance;Sit to/from stand   Upper Body Dressing : Set up;Sitting   Lower Body Dressing: Minimal assistance;Sit to/from stand   Toilet Transfer: Minimal assistance;Ambulation;Comfort height toilet;BSC;Grab bars;RW   Toileting- Clothing Manipulation and Hygiene: Minimal assistance;Sit to/from stand       Functional mobility during ADLs: Minimal assistance;Rolling walker General ADL Comments: Pt requirs min A for balance      Vision Additional Comments: nystagmus noted    Perception     Praxis      Pertinent Vitals/Pain Pain Assessment: No/denies pain     Hand Dominance Right   Extremity/Trunk Assessment Upper Extremity Assessment Upper Extremity Assessment: RUE deficits/detail;LUE deficits/detail RUE Deficits / Details: Pt with ataxia bil. UEs  RUE Coordination: decreased fine motor;decreased gross motor LUE Deficits / Details: Pt with ataxia bil. UEs  LUE Coordination: decreased fine motor;decreased gross motor   Lower Extremity Assessment Lower Extremity Assessment: Generalized weakness   Cervical / Trunk Assessment Cervical / Trunk Assessment: Other exceptions Cervical / Trunk Exceptions: Pt  with truncal ataxia   Communication Communication Communication: No difficulties   Cognition Arousal/Alertness:  Awake/alert Behavior During Therapy: WFL for tasks assessed/performed;Impulsive Overall Cognitive Status: Within Functional Limits for tasks assessed                     General Comments       Exercises       Shoulder Instructions      Home Living Family/patient expects to be discharged to:: Skilled nursing facility Living Arrangements: Alone Available Help at Discharge: Personal care attendant Type of Home: Apartment Home Access: Level entry;Elevator     Home Layout: One level               Home Equipment: Walker - 4 wheels   Additional Comments: Has a  CNA 5 hours /day.      Prior Functioning/Environment Level of Independence: Needs assistance  Gait / Transfers Assistance Needed: uses 4 wheeled RW ADL's / Homemaking Assistance Needed: Pt performs ADLs, PCA assists with IADLs   Comments: Pt uses 02 at home, and reports frequent falls         OT Problem List: Decreased strength;Decreased activity tolerance;Impaired balance (sitting and/or standing);Impaired vision/perception;Decreased coordination;Decreased safety awareness;Decreased knowledge of use of DME or AE;Cardiopulmonary status limiting activity   OT Treatment/Interventions: Self-care/ADL training;Therapeutic exercise;Neuromuscular education;DME and/or AE instruction;Energy conservation;Therapeutic activities;Patient/family education;Balance training    OT Goals(Current goals can be found in the care plan section) Acute Rehab OT Goals Patient Stated Goal: to go to rehab  OT Goal Formulation: With patient Time For Goal Achievement: 03/15/16 Potential to Achieve Goals: Good ADL Goals Pt Will Perform Grooming: with min guard assist;standing Pt Will Perform Lower Body Bathing: with min guard assist;sit to/from stand Pt Will Perform Lower Body Dressing: with min guard assist;sit to/from stand Pt Will Transfer to Toilet: with min guard assist;ambulating;regular height toilet;bedside commode;grab  bars Pt Will Perform Toileting - Clothing Manipulation and hygiene: with min guard assist;sit to/from stand  OT Frequency: Min 2X/week   Barriers to D/C: Decreased caregiver support          Co-evaluation              End of Session Equipment Utilized During Treatment: Rolling walker;Oxygen Nurse Communication: Mobility status  Activity Tolerance: Patient tolerated treatment well Patient left: in bed;with call bell/phone within reach;with bed alarm set   Time: 1610-9604 OT Time Calculation (min): 18 min Charges:  OT General Charges $OT Visit: 1 Procedure OT Evaluation $OT Eval Moderate Complexity: 1 Procedure G-Codes:    Elya Tarquinio M 03/31/2016, 5:00 PM

## 2016-03-08 NOTE — Consult Note (Signed)
CARDIOLOGY CONSULT NOTE   Patient ID: Angelica Garza MRN: [id number], DOB/AGE: 09/07/1945   Admit date: 03/05/2016 Date of Consult: 03/08/2016  Primary Physician: Leone Payor, MD Primary Cardiologist: New patient  Reason for consult:  PAF, anticoagulation  Problem List  Past Medical History:  Diagnosis Date  . Cerebellar degeneration   . Constipation   . COPD (chronic obstructive pulmonary disease) (HCC)   . Cor pulmonale (HCC)    per cardio record  . Degenerative arthritis   . Dysrhythmia    atrial fib  . Falls frequently    gait unsteady  . GERD (gastroesophageal reflux disease)   . Headache    migraines  . Heart palpitations   . Hypertension   . Pneumonia   . Shortness of breath dyspnea    02/29/16 states unchanged    Past Surgical History:  Procedure Laterality Date  . ABDOMINAL HYSTERECTOMY    . APPENDECTOMY    . CHOLECYSTECTOMY    . COLONOSCOPY    . ESOPHAGOGASTRODUODENOSCOPY N/A 11/23/2015   Procedure: ESOPHAGOGASTRODUODENOSCOPY (EGD);  Surgeon: Malissa Hippo, MD;  Location: [facility];  Service: Endoscopy;  Laterality: N/A;  12:25  . EYE SURGERY Bilateral    lasik  . EYE SURGERY Bilateral    cataract extraction with iol     Allergies  Allergies  Allergen Reactions  . Codeine     Muscle spasms.  Tolerates guaifenesin w/ codeine cough syrup.    HPI   The patient is a 70 year old female with h/o hypertension, who presented with a paraesophageal hiatal hernia, s/p robotic laparoscopic Nissen fundoplication on 03/05/2016. While in the hospital she has developed 1 episode of a very short a-flutter with 2:1 block and 3 very short episodes of atrial fibrillation with RVR, all lasting less then 30 minutes with spontaneous cardioversion into the SR. The patient denies CP, SOB, dizziness during the episode.  Patient has cardiologist in [place] November), who has been following her for PAF, she was on verapamil and ASA only. No h/o CHF or prior  MI.  She is disabled, doesn't walk much and only with a walker, falls frequently sec to cerebellar degeneration.  Inpatient Medications  . acetaminophen  1,000 mg Oral TID WC & HS  . amLODipine  5 mg Oral Daily  . [START ON 04/03/2016] cyanocobalamin  1,000 mcg Intramuscular Q30 days  . enoxaparin (LOVENOX) injection  40 mg Subcutaneous Q24H  . fluticasone  1 spray Each Nare Daily  . fluticasone furoate-vilanterol  1 puff Inhalation Daily  . gabapentin  300 mg Oral TID  . lip balm  1 application Topical BID  . losartan  100 mg Oral Daily  . mouth rinse  15 mL Mouth Rinse BID  . pantoprazole  40 mg Oral Daily  . primidone  50 mg Oral QHS  . traZODone  100 mg Oral QHS  . verapamil  180 mg Oral Daily    Family History No h/o premature CAD or CHF.   Social History Social History   Social History  . Marital status: Single    Spouse name: N/A  . Number of children: N/A  . Years of education: N/A   Occupational History  . Not on file.   Social History Main Topics  . Smoking status: Former Smoker    Packs/day: 0.75    Years: 20.00    Types: Cigarettes  . Smokeless tobacco: Never Used  . Alcohol use No  . Drug use: No  .  Sexual activity: Not on file   Other Topics Concern  . Not on file   Social History Narrative  . No narrative on file     Review of Systems  General:  No chills, fever, night sweats or weight changes.  Cardiovascular:  No chest pain, dyspnea on exertion, edema, orthopnea, palpitations, paroxysmal nocturnal dyspnea. Dermatological: No rash, lesions/masses Respiratory: No cough, dyspnea Urologic: No hematuria, dysuria Abdominal:   No nausea, vomiting, diarrhea, bright red blood per rectum, melena, or hematemesis Neurologic:  No visual changes, wkns, changes in mental status. All other systems reviewed and are otherwise negative except as noted above.  Physical Exam  Blood pressure (!) 145/83, pulse (!) 136, temperature 98.1 F (36.7 C),  temperature source Oral, resp. rate 20, height 5\' 3"  (1.6 m), weight 166 lb (75.3 kg), SpO2 92 %.  General: Pleasant, NAD Psych: Normal affect. Neuro: Alert and oriented X 3. Moves all extremities spontaneously. HEENT: Normal  Neck: Supple without bruits or JVD. Lungs:  Resp regular and unlabored, CTA. Heart: RRR no s3, s4, or murmurs. Abdomen: Soft, non-tender, non-distended, BS + x 4.  Extremities: No clubbing, cyanosis or edema. DP/PT/Radials 2+ and equal bilaterally.  Labs  No results for input(s): CKTOTAL, CKMB, TROPONINI in the last 72 hours. Lab Results  Component Value Date   WBC 12.7 (H) 03/07/2016   HGB 11.0 (L) 03/07/2016   HCT 34.3 (L) 03/07/2016   MCV 89.8 03/07/2016   PLT 319 03/07/2016    Recent Labs Lab 03/07/16 1606  NA 132*  K 4.3  CL 98*  CO2 28  BUN 14  CREATININE 0.79  CALCIUM 8.8*  GLUCOSE 139*   No results found for: CHOL, HDL, LDLCALC, TRIG No results found for: DDIMER Invalid input(s): POCBNP  Radiology/Studies  Dg Chest Port 1 View  Result Date: 03/07/2016 CLINICAL DATA:  70 year old presenting with acute onset of cough which began today. Postop day 2 after repair of paraesophageal hiatal hernia and Nissen fundoplication.  IMPRESSION: 1. Small bilateral pleural effusions and associated mild passive atelectasis and/or pneumonia involving the lower lobes. 2. Borderline heart size without evidence of pulmonary edema.   Dg Kayleen MemosUgi W/water Sol Cm  Result Date: 03/06/2016 CLINICAL DATA:  Status post hiatal hernia repair. IMPRESSION: 1. Postoperative changes compatible with fundoplication wrap. A small amount of contrast material passed along the nasogastric tube into the lumen of the stomach without extravasation of contrast.   Echocardiogram - pending  ECG: a-flutter with 2:1 block and a-fib with RVR     ASSESSMENT AND PLAN  1. PAF, paroxysmal atrial flutter, known, based on her report normal LVEF, followed by Dr Hyacinth MeekerMiller, on ASA only. These  episodes are most probably sec to post op state. CHADS-VASc 3 and she should tehnically be on anticoagulation, however she is disabled who falls frequently and just underwent fundoplasty for hiatal hermia with GERD and has increased risk for GI bleeding. I would continue ASA 81 mg po daily only and increase verapamil to 240 mg po daily. If her episodes become more frequent and longer she is advised to follow with her primary cardiologist to re-discuss risk/benefits of full anticoagulation.   2. Essential hypertension - increase verapamil to 240 mg po daily.    Signed, Tobias AlexanderKatarina Sterlin Knightly, MD, [facility] 03/08/2016, 9:38 AM

## 2016-03-09 DIAGNOSIS — K440 Diaphragmatic hernia with obstruction, without gangrene: Principal | ICD-10-CM

## 2016-03-09 MED ORDER — HYDROMORPHONE HCL 1 MG/ML IJ SOLN: 0.5000 mg | INTRAMUSCULAR | Status: DC | PRN

## 2016-03-09 NOTE — Progress Notes (Signed)
Assumed care of patient. Agree with previous RN shift assessment.  Derriana Oser M. Elizandro Laura, RN  

## 2016-03-09 NOTE — Progress Notes (Signed)
4 Days Post-Op  Subjective: Complains of pain all over (not at operative site), tol diet, walked in room only again  Objective: Vital signs in last 24 hours: Temp:  [97.6 F (36.4 C)-98.3 F (36.8 C)] 98.3 F (36.8 C) (09/17 0408) Pulse Rate:  [69-113] 82 (09/17 0408) Resp:  [20] 20 (09/17 0408) BP: (133-157)/(64-84) 133/67 (09/17 0408) SpO2:  [95 %-100 %] 100 % (09/17 0408) Last BM Date: 03/07/16  Intake/Output from previous day: 09/16 0701 - 09/17 0700 In: 240 [P.O.:240] Out: -  Intake/Output this shift: No intake/output data recorded.  General appearance: no distress Resp: clear to auscultation bilaterally Cardio: regular rate and rhythm GI: soft approp tender incisions clean  Lab Results:   Recent Labs  03/07/16 1606  WBC 12.7*  HGB 11.0*  HCT 34.3*  PLT 319   BMET  Recent Labs  03/07/16 1606  NA 132*  K 4.3  CL 98*  CO2 28  GLUCOSE 139*  BUN 14  CREATININE 0.79  CALCIUM 8.8*   PT/INR No results for input(s): LABPROT, INR in the last 72 hours. ABG No results for input(s): PHART, HCO3 in the last 72 hours.  Invalid input(s): PCO2, PO2  Studies/Results: Dg Chest Port 1 View  Result Date: 03/07/2016 CLINICAL DATA:  70 year old presenting with acute onset of cough which began today. Postop day 2 after repair of paraesophageal hiatal hernia and Nissen fundoplication. EXAM: PORTABLE CHEST 1 VIEW COMPARISON:  None. FINDINGS: Cardiac silhouette upper normal in size to slightly enlarged for AP portable technique. Pulmonary vascularity normal. Bilateral pleural effusions and associated mild consolidation in the lower lobes. Lungs otherwise clear. IMPRESSION: 1. Small bilateral pleural effusions and associated mild passive atelectasis and/or pneumonia involving the lower lobes. 2. Borderline heart size without evidence of pulmonary edema. Electronically Signed   By: Hulan Saashomas  Lawrence M.D.   On: 03/07/2016 15:55    Anti-infectives: Anti-infectives    Start      Dose/Rate Route Frequency Ordered Stop   03/05/16 1700  metroNIDAZOLE (FLAGYL) IVPB 500 mg     500 mg 100 mL/hr over 60 Minutes Intravenous Every 8 hours 03/05/16 1448 03/05/16 1823   03/05/16 1600  ceFAZolin (ANCEF) IVPB 2g/100 mL premix     2 g 200 mL/hr over 30 Minutes Intravenous Every 8 hours 03/05/16 1448 03/05/16 1617   03/05/16 0618  ceFAZolin (ANCEF) IVPB 2g/100 mL premix     2 g 200 mL/hr over 30 Minutes Intravenous On call to O.R. 03/05/16 [id number] 03/05/16 [id number]   03/05/16 0618  metroNIDAZOLE (FLAGYL) IVPB 500 mg     500 mg 100 mL/hr over 60 Minutes Intravenous On call to O.R. 03/05/16 [id number] 03/05/16 0842      Assessment/Plan: POD 4 hh repair/Nissen  1. Continue current pain control 2. Continue pureed diet, swallow was fine, ng out 3. Cards- appreciate consult, doing fine this am, wean oxygen 4. pulm toilet 5. lovenox scds 6. dispo- she might be ready to go tomorrow but if dc today she will return right away  [facility],Angelica Vokes 03/09/2016

## 2016-03-10 LAB — BASIC METABOLIC PANEL
ANION GAP: 8 (ref 5–15)
BUN: 9 mg/dL (ref 6–20)
CHLORIDE: 100 mmol/L — AB (ref 101–111)
CO2: 29 mmol/L (ref 22–32)
Calcium: 9.1 mg/dL (ref 8.9–10.3)
Creatinine, Ser: 0.71 mg/dL (ref 0.44–1.00)
GFR calc Af Amer: >60 mL/min (ref >60)
GLUCOSE: 99 mg/dL (ref 65–99)
POTASSIUM: 3.5 mmol/L (ref 3.5–5.1)
Sodium: 137 mmol/L (ref 135–145)

## 2016-03-10 LAB — CBC
HCT: 36.4 % (ref 36.0–46.0)
HEMOGLOBIN: 11.9 g/dL — AB (ref 12.0–15.0)
MCH: 28.5 pg (ref 26.0–34.0)
MCHC: 32.7 g/dL (ref 30.0–36.0)
MCV: 87.3 fL (ref 78.0–100.0)
PLATELETS: 402 10*3/uL — AB (ref 150–400)
RBC: 4.17 MIL/uL (ref 3.87–5.11)
RDW: 12.8 % (ref 11.5–15.5)
WBC: 7.4 10*3/uL (ref 4.0–10.5)

## 2016-03-10 MED ORDER — VERAPAMIL HCL ER 240 MG PO TBCR: 240.0000 mg | EXTENDED_RELEASE_TABLET | Freq: Every day | ORAL | 2 refills | Status: AC

## 2016-03-10 MED ORDER — OXYCODONE HCL 5 MG PO TABS: 5.0000 mg | ORAL_TABLET | ORAL | 0 refills | Status: AC | PRN

## 2016-03-10 NOTE — Discharge Summary (Addendum)
Physician Discharge Summary  Patient ID: Angelica Garza MRN: [id number] DOB/AGE: 12/19/1945 70 y.o.  Admit date: 03/05/2016 Discharge date: 03/10/2016  Patient Care Team: Marjo Bicker, MD as PCP - General (Internal Medicine) Rogene Houston, MD as Consulting Physician (Gastroenterology) Roselind Messier as Consulting Physician (Otolaryngology) Orpah Greek, MD as Referring Physician (Cardiology) Michael Boston, MD as Consulting Physician (General Surgery)  Admission Diagnoses: Principal Problem:   Incarcerated paraesophageal hernia s/p robotic repair 03/05/2016 Active Problems:   Essential hypertension   Chronic Lysbeth Galas ulcer   Delayed gastric emptying s/p pyloroplasty 03/05/2016   Degenerative arthritis   Constipation   Tachycardia   PAF (paroxysmal atrial fibrillation) ([place])   Preoperative cardiovascular examination   Typical atrial flutter ([place])   Contraindication to anticoagulation therapy   Discharge Diagnoses:  Principal Problem:   Incarcerated paraesophageal hernia s/p robotic repair 03/05/2016 Active Problems:   Essential hypertension   Chronic Lysbeth Galas ulcer   Delayed gastric emptying s/p pyloroplasty 03/05/2016   Degenerative arthritis   Constipation   Tachycardia   PAF (paroxysmal atrial fibrillation) (HCC)   Preoperative cardiovascular examination   Typical atrial flutter (H[facility])   Contraindication to anticoagulation therapy   POST-OPERATIVE DIAGNOSIS:   paraesophageal hiatal hernia delayed gastric emptying  SURGERY:  03/05/2016  Procedure(s): XI ROBOTIC REPAIR PARAESOPHAGEAL HIATAL HERNIA WITH  mesh, nissen FUNDOPLICATION PYLOROPLASY, omentopexy  SURGEON:    Surgeon(s): Michael Boston, MD Ralene Ok, MD  Consults: cardiology  Pulmonary PT OT  Hospital Course:   The patient underwent the surgery above.  Postoperatively, the patient gradually mobilized.  Liked her oxygen.  Diuresed.  UGI showed no leak/ostruction.  NGT removed.  Diet advanced  to pureed diet.  BP medicines gradually added.  Went into Afib/RVR - converted back.  Evaluated.  More stable on higher verapamil dose.  Advanced to a pureed diet.  Pain and other symptoms were treated aggressively.    Initially not walking much.  Discussion about SNF for rehab.  But by the time of discharge, the patient was walking well the hallways, eating food, having flatus.  Pain was well-controlled on an oral medications.  SHe really wished to go home.  HAs son & Home aide to take care of her.  Based on meeting discharge criteria and continuing to recover, I felt it was safe for the patient to be discharged from the hospital to further recover with close followup. Postoperative recommendations were discussed in detail.  They are written as well.   Significant Diagnostic Studies:  Results for orders placed or performed during the hospital encounter of 03/05/16 (from the past 72 hour(s))  CBC     Status: Abnormal   Collection Time: 03/10/16  4:28 AM  Result Value Ref Range   WBC 7.4 4.0 - 10.5 K/uL   RBC 4.17 3.87 - 5.11 MIL/uL   Hemoglobin 11.9 (L) 12.0 - 15.0 g/dL   HCT 36.4 36.0 - 46.0 %   MCV 87.3 78.0 - 100.0 fL   MCH 28.5 26.0 - 34.0 pg   MCHC 32.7 30.0 - 36.0 g/dL   RDW 12.8 11.5 - 15.5 %   Platelets 402 (H) 150 - 400 K/uL  Basic metabolic panel     Status: Abnormal   Collection Time: 03/10/16  4:28 AM  Result Value Ref Range   Sodium 137 135 - 145 mmol/L   Potassium 3.5 3.5 - 5.1 mmol/L   Chloride 100 (L) 101 - 111 mmol/L   CO2 29 22 - 32 mmol/L  Glucose, Bld 99 65 - 99 mg/dL   BUN 9 6 - 20 mg/dL   Creatinine, Ser 0.71 0.44 - 1.00 mg/dL   Calcium 9.1 8.9 - 10.3 mg/dL   GFR calc non Af Amer >60 >60 mL/min   GFR calc Af Amer >60 >60 mL/min    Comment: (NOTE) The eGFR has been calculated using the CKD EPI equation. This calculation has not been validated in all clinical situations. eGFR's persistently <60 mL/min signify possible Chronic Kidney Disease.    Anion gap 8  5 - 15    Dg Chest Port 1 View  Result Date: 03/07/2016 CLINICAL DATA:  70 year old presenting with acute onset of cough which began today. Postop day 2 after repair of paraesophageal hiatal hernia and Nissen fundoplication. EXAM: PORTABLE CHEST 1 VIEW COMPARISON:  None. FINDINGS: Cardiac silhouette upper normal in size to slightly enlarged for AP portable technique. Pulmonary vascularity normal. Bilateral pleural effusions and associated mild consolidation in the lower lobes. Lungs otherwise clear. IMPRESSION: 1. Small bilateral pleural effusions and associated mild passive atelectasis and/or pneumonia involving the lower lobes. 2. Borderline heart size without evidence of pulmonary edema. Electronically Signed   By: Evangeline Dakin M.D.   On: 03/07/2016 15:55    Discharge Exam: Blood pressure (!) 178/79, pulse 82, temperature 98 F (36.7 C), temperature source Oral, resp. rate 18, height 5' 3"  (1.6 m), weight 75.3 kg (166 lb), SpO2 95 %.  General: Pt awake/alert/oriented x4 in no major acute distress Eyes: PERRL, normal EOM. Sclera nonicteric Neuro: CN II-XII intact w/o focal sensory/motor deficits. Lymph: No head/neck/groin lymphadenopathy Psych:  No delerium/psychosis/paranoia HENT: Normocephalic, Mucus membranes moist.  No thrush Neck: Supple, No tracheal deviation Chest: No pain.  Good respiratory excursion. CV:  Pulses intact.  Regular rhythm MS: Normal AROM mjr joints.  No obvious deformity Abdomen: Soft, Nondistended.  Nontender.  No incarcerated hernias. Ext:  SCDs BLE.  No significant edema.  No cyanosis Skin: No petechiae / purpura  Discharged Condition: improved   Past Medical History:  Diagnosis Date  . Cerebellar degeneration   . Constipation   . COPD (chronic obstructive pulmonary disease) ([place])   . Cor pulmonale (HCC)    per cardio record  . Degenerative arthritis   . Dysrhythmia    atrial fib  . Falls frequently    gait unsteady  . GERD (gastroesophageal  reflux disease)   . Headache    migraines  . Heart palpitations   . Hypertension   . Pneumonia   . Shortness of breath dyspnea    02/29/16 states unchanged    Past Surgical History:  Procedure Laterality Date  . ABDOMINAL HYSTERECTOMY    . APPENDECTOMY    . CHOLECYSTECTOMY    . COLONOSCOPY    . ESOPHAGOGASTRODUODENOSCOPY N/A 11/23/2015   Procedure: ESOPHAGOGASTRODUODENOSCOPY (EGD);  Surgeon: Rogene Houston, MD;  Location: [facility];  Service: Endoscopy;  Laterality: N/A;  12:25  . EYE SURGERY Bilateral    lasik  . EYE SURGERY Bilateral    cataract extraction with iol    Social History   Social History  . Marital status: Single    Spouse name: N/A  . Number of children: N/A  . Years of education: N/A   Occupational History  . Not on file.   Social History Main Topics  . Smoking status: Former Smoker    Packs/day: 0.75    Years: 20.00    Types: Cigarettes  . Smokeless tobacco: Never Used  . Alcohol  use No  . Drug use: No  . Sexual activity: Not on file   Other Topics Concern  . Not on file   Social History Narrative  . No narrative on file    History reviewed. No pertinent family history.  Current Facility-Administered Medications  Medication Dose Route Frequency Provider Last Rate Last Dose  . acetaminophen (TYLENOL) tablet 1,000 mg  1,000 mg Oral TID WC & HS Michael Boston, MD   1,000 mg at 03/10/16 1544  . amLODipine (NORVASC) tablet 5 mg  5 mg Oral Daily Michael Boston, MD   5 mg at 03/10/16 0912  . butalbital-acetaminophen-caffeine (FIORICET, ESGIC) 50-325-40 MG per tablet 1 tablet  1 tablet Oral Daily PRN Michael Boston, MD      . Derrill Memo ON 04/03/2016] cyanocobalamin ((VITAMIN B-12)) injection 1,000 mcg  1,000 mcg Intramuscular Q30 days Michael Boston, MD      . diphenhydrAMINE (BENADRYL) injection 12.5-25 mg  12.5-25 mg Intravenous Q6H PRN Michael Boston, MD   12.5 mg at 03/07/16 2105  . enoxaparin (LOVENOX) injection 40 mg  40 mg Subcutaneous Q24H Michael Boston, MD   40 mg at 03/10/16 0949  . fluticasone (FLONASE) 50 MCG/ACT nasal spray 1 spray  1 spray Each Nare Daily Michael Boston, MD   1 spray at 03/10/16 [phone]  . fluticasone furoate-vilanterol (BREO ELLIPTA) 100-25 MCG/INH 1 puff  1 puff Inhalation Daily Michael Boston, MD   1 puff at 03/10/16 0837  . gabapentin (NEURONTIN) capsule 300 mg  300 mg Oral TID Michael Boston, MD   300 mg at 03/10/16 1544  . guaiFENesin-codeine 100-10 MG/5ML solution 5 mL  5 mL Oral TID PRN Michael Boston, MD      . hydrALAZINE (APRESOLINE) injection 10 mg  10 mg Intravenous Q2H PRN Michael Boston, MD      . HYDROmorphone (DILAUDID) injection 0.5 mg  0.5 mg Intravenous Q4H PRN Rolm Bookbinder, MD      . lip balm (CARMEX) ointment 1 application  1 application Topical BID Michael Boston, MD   1 application at 97/67/34 1000  . losartan (COZAAR) tablet 100 mg  100 mg Oral Daily Michael Boston, MD   100 mg at 03/10/16 0914  . magic mouthwash  15 mL Oral QID PRN Michael Boston, MD      . MEDLINE mouth rinse  15 mL Mouth Rinse BID Michael Boston, MD   15 mL at 03/10/16 1000  . menthol-cetylpyridinium (CEPACOL) lozenge 3 mg  1 lozenge Oral PRN Michael Boston, MD   3 mg at 03/06/16 0050  . methocarbamol (ROBAXIN) tablet 500 mg  500 mg Oral Q8H PRN Rolm Bookbinder, MD      . metoCLOPramide [facility]) injection 5-10 mg  5-10 mg Intravenous Q6H PRN Michael Boston, MD      . metoprolol (LOPRESSOR) injection 5 mg  5 mg Intravenous Q6H PRN Michael Boston, MD   5 mg at 03/09/16 2002  . ondansetron (ZOFRAN) injection 4 mg  4 mg Intravenous Q6H PRN Michael Boston, MD   4 mg at 03/06/16 0050   Or  . ondansetron (ZOFRAN) 8 mg in sodium chloride 0.9 % 50 mL IVPB  8 mg Intravenous Q6H PRN Michael Boston, MD      . oxyCODONE (Oxy IR/ROXICODONE) immediate release tablet 5-10 mg  5-10 mg Oral Q4H PRN Michael Boston, MD   5 mg at 03/10/16 1728  . pantoprazole (PROTONIX) EC tablet 40 mg  40 mg Oral Daily Michael Boston, MD   40 mg  at 03/10/16 0912  . phenol (CHLORASEPTIC)  mouth spray 2 spray  2 spray Mouth/Throat PRN Michael Boston, MD      . primidone (MYSOLINE) tablet 50 mg  50 mg Oral QHS Michael Boston, MD   50 mg at 03/09/16 2058  . prochlorperazine (COMPAZINE) injection 5-10 mg  5-10 mg Intravenous Q4H PRN Michael Boston, MD      . simethicone [facility]) chewable tablet 40 mg  40 mg Oral Q6H PRN Michael Boston, MD      . traZODone (DESYREL) tablet 100 mg  100 mg Oral QHS Michael Boston, MD   100 mg at 03/09/16 2058  . verapamil (CALAN-SR) CR tablet 240 mg  240 mg Oral Daily Dorothy Spark, MD   240 mg at 03/10/16 1144     Allergies  Allergen Reactions  . Codeine     Muscle spasms.  Tolerates guaifenesin w/ codeine cough syrup.    Disposition: 01-Home or Self Care  Discharge Instructions    Call MD for:    Complete by:  As directed    Temperature > 101.54F   Call MD for:  extreme fatigue    Complete by:  As directed    Call MD for:  hives    Complete by:  As directed    Call MD for:  persistant nausea and vomiting    Complete by:  As directed    Call MD for:  redness, tenderness, or signs of infection (pain, swelling, redness, odor or green/yellow discharge around incision site)    Complete by:  As directed    Call MD for:  severe uncontrolled pain    Complete by:  As directed    Diet general    Complete by:  As directed    See esophageal surgery instructions.  In general: pureed/blenderized diet for first 2 weeks postop, then advance to soft diet for 1-2 weeks, then regular diet by 4-6 weeks   Discharge instructions    Complete by:  As directed    Please see discharge instruction sheets.   Also refer to any handouts/printouts that may have been given from the [facility] surgery office (if you visited [place] there before surgery) Please call our office if you have any questions or concerns [phone]   Discharge wound care:    Complete by:  As directed    If you have closed incisions: Shower and bathe over these incisions with soap and water every day.   It is OK to wash over the dressings: they are waterproof. Remove all surgical dressings on postoperative day #3.  You do not need to replace dressings over the closed incisions unless you feel more comfortable with a Band-Aid covering it.   If you have an open wound: That requires packing, so please see wound care instructions.   In general, remove all dressings, wash wound with soap and water and then replace with saline moistened gauze.  Do the dressing change at least every day.    Please call our office [phone] if you have further questions.   Driving Restrictions    Complete by:  As directed    No driving until off narcotics and can safely swerve away without pain during an emergency   Increase activity slowly    Complete by:  As directed    Lifting restrictions    Complete by:  As directed    Avoid heavy lifting initially, <20 pounds at first.   Do not push through pain.  You have no specific weight limit: If it hurts to do, DON'T DO IT.    If you feel no pain, you are not injuring anything.  Pain will protect you from injury.   Coughing and sneezing are far more stressful to your incision than any lifting.   Avoid resuming heavy lifting (>50 pounds) or other intense activity until off all narcotic pain medications.   When want to exercise more, give yourself 2 weeks to gradually get back to full intense exercise/activity.   May shower / Bathe    Complete by:  As directed    [place].  It is fine for dressings or wounds to be washed/rinsed.  Use gentle soap & water.  This will help the incisions and/or wounds get clean & minimize infection.   May walk up steps    Complete by:  As directed    Sexual Activity Restrictions    Complete by:  As directed    Sexual activity as tolerated.  Do not push through pain.  Pain will protect you from injury.   Walk with assistance    Complete by:  As directed    Walk over an hour a day.  May use a walker/cane/companion to help  with balance and stamina.       Medication List    TAKE these medications   amLODipine 5 MG tablet Commonly known as:  NORVASC Take 5 mg by mouth daily.   BREO ELLIPTA 100-25 MCG/INH Aepb Generic drug:  fluticasone furoate-vilanterol Inhale 1 puff into the lungs 2 (two) times daily.   butalbital-acetaminophen-caffeine 50-325-40 MG tablet Commonly known as:  FIORICET, ESGIC Take 1 tablet by mouth daily as needed for headache.   [employer] SPECTRAVITE ADULT 50+ PO Take 1 tablet by mouth daily.   cyanocobalamin 1000 MCG/ML injection Commonly known as:  (VITAMIN B-12) Inject 1,000 mcg into the muscle every 30 (thirty) days.   fluticasone 50 MCG/ACT nasal spray Commonly known as:  FLONASE Place 1 spray into both nostrils daily.   gabapentin 300 MG capsule Commonly known as:  NEURONTIN Take 300 mg by mouth 3 (three) times daily.   guaiFENesin-codeine 100-10 MG/5ML syrup Commonly known as:  ROBITUSSIN AC Take 5 mLs by mouth 3 (three) times daily as needed for cough.   losartan 100 MG tablet Commonly known as:  COZAAR Take 100 mg by mouth daily.   metoCLOPramide 10 MG tablet Commonly known as:  REGLAN Take 0.5 tablets (5 mg total) by mouth 3 (three) times daily before meals.   oxybutynin 10 MG 24 hr tablet Commonly known as:  DITROPAN-XL Take 10 mg by mouth 2 (two) times daily.   oxyCODONE 5 MG immediate release tablet Commonly known as:  Oxy IR/ROXICODONE Take 1-2 tablets (5-10 mg total) by mouth every 4 (four) hours as needed for moderate pain, severe pain or breakthrough pain.   pantoprazole 40 MG tablet Commonly known as:  PROTONIX Take 40 mg by mouth daily.   polyethylene glycol packet Commonly known as:  MIRALAX / GLYCOLAX Take 17 g by mouth daily.   pregabalin 100 MG capsule Commonly known as:  LYRICA Take 100 mg by mouth 2 (two) times daily.   primidone 50 MG tablet Commonly known as:  MYSOLINE Take 50 mg by mouth at bedtime.   ranitidine 150 MG  capsule Commonly known as:  ZANTAC Take 150 mg by mouth every evening.   sucralfate 1 g tablet Commonly known as:  CARAFATE Take 1 g by mouth  2 (two) times daily.   traZODone 50 MG tablet Commonly known as:  DESYREL Take 100 mg by mouth at bedtime. 2 tabs qhs   umeclidinium-vilanterol 62.5-25 MCG/INH Aepb Commonly known as:  ANORO ELLIPTA Inhale 1 puff into the lungs daily as needed.   verapamil 240 MG CR tablet Commonly known as:  CALAN-SR Take 1 tablet (240 mg total) by mouth daily. Start taking on:  03/11/2016 What changed:  medication strength  how much to take  when to take this         Signed: Morton Peters, M.D., F.A.C.S. Gastrointestinal and Minimally Invasive Surgery [facility] [address] [place], [place] [address] [phone] Main / Paging   03/10/2016, 5:55 PM

## 2016-03-10 NOTE — Clinical Social Work Placement (Signed)
   CLINICAL SOCIAL WORK PLACEMENT  NOTE  Date:  03/10/2016  Patient Details  Name: Angelica Garza MRN: [id number] Date of Birth: Mar 07, 1946  Clinical Social Work is seeking post-discharge placement for this patient at the Skilled  Nursing Facility level of care (*CSW will initial, date and re-position this form in  chart as items are completed):  Yes   Patient/family provided with [place] Clinical Social Work Department's list of facilities offering this level of care within the geographic area requested by the patient (or if unable, by the patient's family).  Yes   Patient/family informed of their freedom to choose among providers that offer the needed level of care, that participate in Medicare, Medicaid or managed care program needed by the patient, have an available bed and are willing to accept the patient.  Yes   Patient/family informed of [facility]'s ownership interest [facility] and [facility], as well as of the fact that they are under no obligation to receive care at these facilities.  PASRR submitted to EDS on       PASRR number received on       Existing PASRR number confirmed on       FL2 transmitted to all facilities in geographic area requested by pt/family on 03/10/16     FL2 transmitted to all facilities within larger geographic area on       Patient informed that his/her managed care company has contracts with or will negotiate with certain facilities, including the following:            Patient/family informed of bed offers received.  Patient chooses bed at       Physician recommends and patient chooses bed at      Patient to be transferred to   on  .  Patient to be transferred to facility by       Patient family notified on   of transfer.  Name of family member notified:        PHYSICIAN       Additional Comment:    _______________________________________________ Arlyss RepressHarrison, Tiffini Blacksher F, LCSW 03/10/2016, 1:41 PM

## 2016-03-10 NOTE — Progress Notes (Signed)
5 Days Post-Op   Subjective: Complains of "feeling achy all over" due to sitting, no pain not at operative sites.  States good appetite, walked hallway with family yesterday without trouble, sleeping through the night.  Still uses nursing assistance to go to bathroom.  Last BM Saturday.  Patient states ready to go home.  Objective: Vital signs in last 24 hours: Vitals:   03/10/16 0535 03/10/16 0804  BP: (!) 163/82 (!) 148/70  Pulse: 78   Resp: 18 18  Temp: 98.6 F (37 C) 97.7 F (36.5 C)    Last BM Date: 03/08/16  Intake/Output from previous day:  Intake/Output Summary (Last 24 hours) at 03/10/16 1257 Last data filed at 03/09/16 2300  Gross per 24 hour  Intake              720 ml  Output                0 ml  Net              720 ml   Intake/Output past 24 hrs: No intake/output data recorded.   General appearance: no distress Resp: clear to auscultation bilaterally Cardio: regular rate and rhythm GI: soft abdomen, nontender at incisions, clean and without discharge/bleeding   Lab Results:  Recent Results (from the past 2160 hour(s))  Type and screen All Cardiac and thoracic surgeries, spinal fusions, myomectomies, craniotomies, colon & liver resections, total joint revisions, same day c-section with placenta previa or accreta.     Status: None   Collection Time: 02/27/16 12:50 PM  Result Value Ref Range   ABO/RH(D) O NEG    Antibody Screen NEG    Sample Expiration 03/08/2016    Extend sample reason NO TRANSFUSIONS OR PREGNANCY IN THE PAST 3 MONTHS   ABO/Rh     Status: None   Collection Time: 02/27/16 12:50 PM  Result Value Ref Range   ABO/RH(D) O NEG   Basic metabolic panel     Status: Abnormal   Collection Time: 02/27/16  1:00 PM  Result Value Ref Range   Sodium 137 135 - 145 mmol/L   Potassium 4.7 3.5 - 5.1 mmol/L   Chloride 104 101 - 111 mmol/L   CO2 27 22 - 32 mmol/L   Glucose, Bld 102 (H) 65 - 99 mg/dL   BUN 14 6 - 20 mg/dL   Creatinine, Ser 0.89 0.44 -  1.00 mg/dL   Calcium 9.6 8.9 - 10.3 mg/dL   GFR calc non Af Amer >60 >60 mL/min   GFR calc Af Amer >60 >60 mL/min    Comment: (NOTE) The eGFR has been calculated using the CKD EPI equation. This calculation has not been validated in all clinical situations. eGFR's persistently <60 mL/min signify possible Chronic Kidney Disease.    Anion gap 6 5 - 15  CBC     Status: Abnormal   Collection Time: 02/27/16  1:00 PM  Result Value Ref Range   WBC 10.9 (H) 4.0 - 10.5 K/uL   RBC 4.28 3.87 - 5.11 MIL/uL   Hemoglobin 12.2 12.0 - 15.0 g/dL   HCT 36.7 36.0 - 46.0 %   MCV 85.7 78.0 - 100.0 fL   MCH 28.5 26.0 - 34.0 pg   MCHC 33.2 30.0 - 36.0 g/dL   RDW 12.9 11.5 - 15.5 %   Platelets 328 150 - 400 K/uL  Basic metabolic panel     Status: Abnormal   Collection Time: 03/06/16  4:35 AM  Result  Value Ref Range   Sodium 134 (L) 135 - 145 mmol/L   Potassium 4.8 3.5 - 5.1 mmol/L   Chloride 101 101 - 111 mmol/L   CO2 26 22 - 32 mmol/L   Glucose, Bld 113 (H) 65 - 99 mg/dL   BUN 13 6 - 20 mg/dL   Creatinine, Ser 0.73 0.44 - 1.00 mg/dL   Calcium 9.1 8.9 - 10.3 mg/dL   GFR calc non Af Amer >60 >60 mL/min   GFR calc Af Amer >60 >60 mL/min    Comment: (NOTE) The eGFR has been calculated using the CKD EPI equation. This calculation has not been validated in all clinical situations. eGFR's persistently <60 mL/min signify possible Chronic Kidney Disease.    Anion gap 7 5 - 15  Magnesium     Status: None   Collection Time: 03/06/16  4:35 AM  Result Value Ref Range   Magnesium 1.7 1.7 - 2.4 mg/dL  Basic metabolic panel     Status: Abnormal   Collection Time: 03/07/16  4:06 PM  Result Value Ref Range   Sodium 132 (L) 135 - 145 mmol/L   Potassium 4.3 3.5 - 5.1 mmol/L   Chloride 98 (L) 101 - 111 mmol/L   CO2 28 22 - 32 mmol/L   Glucose, Bld 139 (H) 65 - 99 mg/dL   BUN 14 6 - 20 mg/dL   Creatinine, Ser 0.79 0.44 - 1.00 mg/dL   Calcium 8.8 (L) 8.9 - 10.3 mg/dL   GFR calc non Af Amer >60 >60 mL/min    GFR calc Af Amer >60 >60 mL/min    Comment: (NOTE) The eGFR has been calculated using the CKD EPI equation. This calculation has not been validated in all clinical situations. eGFR's persistently <60 mL/min signify possible Chronic Kidney Disease.    Anion gap 6 5 - 15  CBC     Status: Abnormal   Collection Time: 03/07/16  4:06 PM  Result Value Ref Range   WBC 12.7 (H) 4.0 - 10.5 K/uL   RBC 3.82 (L) 3.87 - 5.11 MIL/uL   Hemoglobin 11.0 (L) 12.0 - 15.0 g/dL   HCT 34.3 (L) 36.0 - 46.0 %   MCV 89.8 78.0 - 100.0 fL   MCH 28.8 26.0 - 34.0 pg   MCHC 32.1 30.0 - 36.0 g/dL   RDW 13.1 11.5 - 15.5 %   Platelets 319 150 - 400 K/uL  CBC     Status: Abnormal   Collection Time: 03/10/16  4:28 AM  Result Value Ref Range   WBC 7.4 4.0 - 10.5 K/uL   RBC 4.17 3.87 - 5.11 MIL/uL   Hemoglobin 11.9 (L) 12.0 - 15.0 g/dL   HCT 36.4 36.0 - 46.0 %   MCV 87.3 78.0 - 100.0 fL   MCH 28.5 26.0 - 34.0 pg   MCHC 32.7 30.0 - 36.0 g/dL   RDW 12.8 11.5 - 15.5 %   Platelets 402 (H) 150 - 400 K/uL  Basic metabolic panel     Status: Abnormal   Collection Time: 03/10/16  4:28 AM  Result Value Ref Range   Sodium 137 135 - 145 mmol/L   Potassium 3.5 3.5 - 5.1 mmol/L   Chloride 100 (L) 101 - 111 mmol/L   CO2 29 22 - 32 mmol/L   Glucose, Bld 99 65 - 99 mg/dL   BUN 9 6 - 20 mg/dL   Creatinine, Ser 0.71 0.44 - 1.00 mg/dL   Calcium 9.1 8.9 - 10.3 mg/dL  GFR calc non Af Amer >60 >60 mL/min   GFR calc Af Amer >60 >60 mL/min    Comment: (NOTE) The eGFR has been calculated using the CKD EPI equation. This calculation has not been validated in all clinical situations. eGFR's persistently <60 mL/min signify possible Chronic Kidney Disease.    Anion gap 8 5 - 15    Studies/Results: No new  Anti-infectives: Anti-infectives    Start     Dose/Rate Route Frequency Ordered Stop   03/05/16 1700  metroNIDAZOLE (FLAGYL) IVPB 500 mg     500 mg 100 mL/hr over 60 Minutes Intravenous Every 8 hours 03/05/16 1448  03/05/16 1823   03/05/16 1600  ceFAZolin (ANCEF) IVPB 2g/100 mL premix     2 g 200 mL/hr over 30 Minutes Intravenous Every 8 hours 03/05/16 1448 03/05/16 1617   03/05/16 0618  ceFAZolin (ANCEF) IVPB 2g/100 mL premix     2 g 200 mL/hr over 30 Minutes Intravenous On call to O.R. 03/05/16 8979 03/05/16 1504   03/05/16 0618  metroNIDAZOLE (FLAGYL) IVPB 500 mg     500 mg 100 mL/hr over 60 Minutes Intravenous On call to O.R. 03/05/16 1364 03/05/16 3837      Assessment/Plan: POD 5 Hiatal Hernia Repair with Nissen  1. Continue current pain control 2. Continue current diet, tolerating well 3. Continue pulminary toilet 4. DVT Prophylaxis:  lovenox, scds 5. Disposition:  Possible discharge today   Olene Floss, PA-S, [employer] 03/10/2016

## 2016-03-10 NOTE — Progress Notes (Signed)
PT Cancellation Note  Patient Details Name: Angelica Garza MRN: [id number] DOB: 03/20/1946   Cancelled Treatment:    Reason Eval/Treat Not Completed: Other (comment) (patient reported that she was being discharged and had walked in the hall. )   Rada HayHill, Caty Tessler Elizabeth 03/10/2016, 5:16 PM Blanchard KelchKaren Mckinnley Smithey PT [phone]

## 2016-03-10 NOTE — Discharge Instructions (Signed)
EATING AFTER YOUR ESOPHAGEAL SURGERY °(Stomach Fundoplication, Hiatal Hernia repair, Achalasia surgery, etc) ° °###################################################################### ° °EAT °Start with a pureed / full liquid diet (see below) °Gradually transition to a high fiber diet with a fiber supplement over the next month after discharge.   ° °WALK °Walk an hour a day.  Control your pain to do that.   ° °CONTROL PAIN °Control pain so that you can walk, sleep, tolerate sneezing/coughing, go up/down stairs. ° °HAVE A BOWEL MOVEMENT DAILY °Keep your bowels regular to avoid problems.  OK to try a laxative to override constipation.  OK to use an antidairrheal to slow down diarrhea.  Call if not better after 2 tries ° °CALL IF YOU HAVE PROBLEMS/CONCERNS °Call if you are still struggling despite following these instructions. °Call if you have concerns not answered by these instructions ° °###################################################################### ° ° °After your esophageal surgery, expect some sticking with swallowing over the next 1-2 months.   ° °If food sticks when you eat, it is called "dysphagia".  This is due to swelling around your esophagus at the wrap & hiatal diaphragm repair.  It will gradually ease off over the next few months.  To help you through this temporary phase, we start you out on a pureed (blenderized) diet. ° °Your first meal in the hospital was thin liquids.  You should have been given a pureed diet by the time you left the hospital.  We ask patients to stay on a pureed diet for the first 2-3 weeks to avoid anything getting "stuck" near your recent surgery.  Don't be alarmed if your ability to swallow doesn't progress according to this plan.  Everyone is different and some diets can advance more or less quickly.   ° ° °Some BASIC RULES to follow are: °· Maintain an upright position whenever eating or drinking. °· Take small bites - just a teaspoon size bite at a time. °· Eat slowly.   It may also help to eat only one food at a time. °· Consider nibbling through smaller, more frequent meals & avoid the urge to eat BIG meals °· Do not push through feelings of fullness, nausea, or bloatedness °· Do not mix solid foods and liquids in the same mouthful °· Try not to "wash foods down" with large gulps of liquids. °· Avoid carbonated (bubbly/fizzy) drinks.   °· Avoid foods that make you feel gassy or bloated.  Start with bland foods first.  Wait on trying greasy, fried, or spicy meals until you are tolerating more bland solids well. °· Understand that it will be hard to burp and belch at first.  This gradually improves with time.  Expect to be more gassy/flatulent/bloated initially.  Walking will help your body manage it better. °· Consider using medications for bloating that contain simethicone such as  Maalox or Gas-X  °· Eat in a relaxed atmosphere & minimize distractions. °· Avoid talking while eating.   °· Do not use straws. °· Following each meal, sit in an upright position (90 degree angle) for 60 to 90 minutes.  Going for a short walk can help as well °· If food does stick, don't panic.  Try to relax and let the food pass on its own.  Sipping WARM LIQUID such as strong hot black tea can also help slide it down. ° ° °Be gradual in changes & use common sense: ° °-If you easily tolerating a certain "level" of foods, advance to the next level gradually °-If you are   having trouble swallowing a particular food, then avoid it.   °-If food is sticking when you advance your diet, go back to thinner previous diet (the lower LEVEL) for 1-2 days. ° °LEVEL 1 = PUREED DIET ° °Do for the first 2 WEEKS AFTER SURGERY ° °-Foods in this group are pureed or blenderized to a smooth, mashed potato-like consistency.  °-If necessary, the pureed foods can keep their shape with the addition of a thickening agent.   °-Meat should be pureed to a smooth, pasty consistency.  Hot broth or gravy may be added to the pureed  meat, approximately 1 oz. of liquid per 3 oz. serving of meat. °-CAUTION:  If any foods do not puree into a smooth consistency, swallowing will be more difficult.  (For example, nuts or seeds sometimes do not blend well.) ° °Hot Foods Cold Foods  °Pureed scrambled eggs and cheese Pureed cottage cheese  °Baby cereals Thickened juices and nectars  °Thinned cooked cereals (no lumps) Thickened milk or eggnog  °Pureed French toast or pancakes Ensure  °Mashed potatoes Ice cream  °Pureed parsley, au gratin, scalloped potatoes, candied sweet potatoes Fruit or Italian ice, sherbet  °Pureed buttered or alfredo noodles Plain yogurt  °Pureed vegetables (no corn or peas) Instant breakfast  °Pureed soups and creamed soups Smooth pudding, mousse, custard  °Pureed scalloped apples Whipped gelatin  °Gravies Sugar, syrup, honey, jelly  °Sauces, cheese, tomato, barbecue, white, creamed Cream  °Any baby food Creamer  °Alcohol in moderation (not beer or champagne) Margarine  °Coffee or tea Mayonnaise  ° Ketchup, mustard  ° Apple sauce  ° °SAMPLE MENU:  PUREED DIET °Breakfast Lunch Dinner  °· Orange juice, 1/2 cup °· Cream of wheat, 1/2 cup · Pineapple juice, 1/2 cup · Pureed turkey, barley soup, 3/4 cup °· Pureed Hawaiian chicken, 3 oz  °· Scrambled eggs, mashed or blended with cheese, 1/2 cup °· Tea or coffee, 1 cup  °· Whole milk, 1 cup  °· Non-dairy creamer, 2 Tbsp. · Mashed potatoes, 1/2 cup °· Pureed cooled broccoli, 1/2 cup °· Apple sauce, 1/2 cup °· Coffee or tea · Mashed potatoes, 1/2 cup °· Pureed spinach, 1/2 cup °· Frozen yogurt, 1/2 cup °· Tea or coffee  ° ° ° ° °LEVEL 2 = SOFT DIET ° °After your first 2 weeks, you can advance to a soft diet.   °Keep on this diet until everything goes down easily. ° °Hot Foods Cold Foods  °White fish Cottage cheese  °Stuffed fish Junior baby fruit  °Baby food meals Semi thickened juices  °Minced soft cooked, scrambled, poached eggs nectars  °Souffle & omelets Ripe mashed bananas  °Cooked  cereals Canned fruit, pineapple sauce, milk  °potatoes Milkshake  °Buttered or Alfredo noodles Custard  °Cooked cooled vegetable Puddings, including tapioca  °Sherbet Yogurt  °Vegetable soup or alphabet soup Fruit ice, Italian ice  °Gravies Whipped gelatin  °Sugar, syrup, honey, jelly Junior baby desserts  °Sauces:  Cheese, creamed, barbecue, tomato, white Cream  °Coffee or tea Margarine  ° °SAMPLE MENU:  LEVEL 2 °Breakfast Lunch Dinner  °· Orange juice, 1/2 cup °· Oatmeal, 1/2 cup °· Scrambled eggs with cheese, 1/2 cup °· Decaffeinated tea, 1 cup °· Whole milk, 1 cup °· Non-dairy creamer, 2 Tbsp · Pineapple juice, 1/2 cup °· Minced beef, 3 oz °· Gravy, 2 Tbsp °· Mashed potatoes, 1/2 cup °· Minced fresh broccoli, 1/2 cup °· Applesauce, 1/2 cup °· Coffee, 1 cup · Turkey, barley soup, 3/4 cup °·   Minced Hawaiian chicken, 3 oz °· Mashed potatoes, 1/2 cup °· Cooked spinach, 1/2 cup °· Frozen yogurt, 1/2 cup °· Non-dairy creamer, 2 Tbsp  ° ° ° ° °LEVEL 3 = CHOPPED DIET ° °-After all the foods in level 2 (soft diet) are passing through well you should advance up to more chopped foods.  °-It is still important to cut these foods into small pieces and eat slowly. ° °Hot Foods Cold Foods  °Poultry Cottage cheese  °Chopped Swedish meatballs Yogurt  °Meat salads (ground or flaked meat) Milk  °Flaked fish (tuna) Milkshakes  °Poached or scrambled eggs Soft, cold, dry cereal  °Souffles and omelets Fruit juices or nectars  °Cooked cereals Chopped canned fruit  °Chopped French toast or pancakes Canned fruit cocktail  °Noodles or pasta (no rice) Pudding, mousse, custard  °Cooked vegetables (no frozen peas, corn, or mixed vegetables) Green salad  °Canned small sweet peas Ice cream  °Creamed soup or vegetable soup Fruit ice, Italian ice  °Pureed vegetable soup or alphabet soup Non-dairy creamer  °Ground scalloped apples Margarine  °Gravies Mayonnaise  °Sauces:  Cheese, creamed, barbecue, tomato, white Ketchup  °Coffee or tea Mustard   ° °SAMPLE MENU:  LEVEL 3 °Breakfast Lunch Dinner  °· Orange juice, 1/2 cup °· Oatmeal, 1/2 cup °· Scrambled eggs with cheese, 1/2 cup °· Decaffeinated tea, 1 cup °· Whole milk, 1 cup °· Non-dairy creamer, 2 Tbsp °· Ketchup, 1 Tbsp °· Margarine, 1 tsp °· Salt, 1/4 tsp °· Sugar, 2 tsp · Pineapple juice, 1/2 cup °· Ground beef, 3 oz °· Gravy, 2 Tbsp °· Mashed potatoes, 1/2 cup °· Cooked spinach, 1/2 cup °· Applesauce, 1/2 cup °· Decaffeinated coffee °· Whole milk °· Non-dairy creamer, 2 Tbsp °· Margarine, 1 tsp °· Salt, 1/4 tsp · Pureed turkey, barley soup, 3/4 cup °· Barbecue chicken, 3 oz °· Mashed potatoes, 1/2 cup °· Ground fresh broccoli, 1/2 cup °· Frozen yogurt, 1/2 cup °· Decaffeinated tea, 1 cup °· Non-dairy creamer, 2 Tbsp °· Margarine, 1 tsp °· Salt, 1/4 tsp °· Sugar, 1 tsp  ° ° °LEVEL 4:  REGULAR FOODS ° °-Foods in this group are soft, moist, regularly textured foods.   °-This level includes meat and breads, which tend to be the hardest things to swallow.   °-Eat very slowly, chew well and continue to avoid carbonated drinks. °-most people are at this level in 4-6 weeks ° °Hot Foods Cold Foods  °Baked fish or skinned Soft cheeses - cottage cheese  °Souffles and omelets Cream cheese  °Eggs Yogurt  °Stuffed shells Milk  °Spaghetti with meat sauce Milkshakes  °Cooked cereal Cold dry cereals (no nuts, dried fruit, coconut)  °French toast or pancakes Crackers  °Buttered toast Fruit juices or nectars  °Noodles or pasta (no rice) Canned fruit  °Potatoes (all types) Ripe bananas  °Soft, cooked vegetables (no corn, lima, or baked beans) Peeled, ripe, fresh fruit  °Creamed soups or vegetable soup Cakes (no nuts, dried fruit, coconut)  °Canned chicken noodle soup Plain doughnuts  °Gravies Ice cream  °Bacon dressing Pudding, mousse, custard  °Sauces:  Cheese, creamed, barbecue, tomato, white Fruit ice, Italian ice, sherbet  °Decaffeinated tea or coffee Whipped gelatin  °Pork chops Regular gelatin  ° Canned fruited  gelatin molds  ° Sugar, syrup, honey, jam, jelly  ° Cream  ° Non-dairy  ° Margarine  ° Oil  ° Mayonnaise  ° Ketchup  ° Mustard  ° °TROUBLESHOOTING IRREGULAR BOWELS  °1) Avoid extremes of bowel   movements (no bad constipation/diarrhea)  °2) Miralax 17gm mixed in 8oz. water or juice-daily. May use BID as needed.  °3) Gas-x,Phazyme, etc. as needed for gas & bloating.  °4) Soft,bland diet. No spicy,greasy,fried foods.  °5) Prilosec over-the-counter as needed  °6) May hold gluten/wheat products from diet to see if symptoms improve.  °7) May try probiotics (Align, Activa, etc) to help calm the bowels down  °7) If symptoms become worse call back immediately. ° ° ° °If you have any questions please call our office at [facility]: [phone]. ° °LAPAROSCOPIC SURGERY: POST OP INSTRUCTIONS ° °###################################################################### ° °EAT °Gradually transition to a high fiber diet with a fiber supplement over the next few weeks after discharge.  Start with a pureed / full liquid diet (see below) ° °WALK °Walk an hour a day.  Control your pain to do that.   ° °CONTROL PAIN °Control pain so that you can walk, sleep, tolerate sneezing/coughing, go up/down stairs. ° °HAVE A BOWEL MOVEMENT DAILY °Keep your bowels regular to avoid problems.  OK to try a laxative to override constipation.  OK to use an antidairrheal to slow down diarrhea.  Call if not better after 2 tries ° °CALL IF YOU HAVE PROBLEMS/CONCERNS °Call if you are still struggling despite following these instructions. °Call if you have concerns not answered by these instructions ° °###################################################################### ° ° ° °1. DIET: Follow a light bland diet the first 24 hours after arrival home, such as soup, liquids, crackers, etc.  Be sure to include lots of fluids daily.  Avoid fast food or heavy meals as your are more likely to get nauseated.  Eat a low fat the next few days after  surgery.   °2. Take your usually prescribed home medications unless otherwise directed. °3. PAIN CONTROL: °a. Pain is best controlled by a usual combination of three different methods TOGETHER: °i. Ice/Heat °ii. Over the counter pain medication °iii. Prescription pain medication °b. Most patients will experience some swelling and bruising around the incisions.  Ice packs or heating pads (30-60 minutes up to 6 times a day) will help. Use ice for the first few days to help decrease swelling and bruising, then switch to heat to help relax tight/sore spots and speed recovery.  Some people prefer to use ice alone, heat alone, alternating between ice & heat.  Experiment to what works for you.  Swelling and bruising can take several weeks to resolve.   °c. It is helpful to take an over-the-counter pain medication regularly for the first few weeks.  Choose one of the following that works best for you: °i. Naproxen (Aleve, etc)  Two 220mg tabs twice a day °ii. Ibuprofen (Advil, etc) Three 200mg tabs four times a day (every meal & bedtime) °iii. Acetaminophen (Tylenol, etc) 500-650mg four times a day (every meal & bedtime) °d. A  prescription for pain medication (such as oxycodone, hydrocodone, etc) should be given to you upon discharge.  Take your pain medication as prescribed.  °i. If you are having problems/concerns with the prescription medicine (does not control pain, nausea, vomiting, rash, itching, etc), please call us [phone] to see if we need to switch you to a different pain medicine that will work better for you and/or control your side effect better. °ii. If you need a refill on your pain medication, please contact your pharmacy.  They will contact our office to request authorization. Prescriptions will not be filled after 5 pm or on week-ends. °4. Avoid getting   constipated.  Between the surgery and the pain medications, it is common to experience some constipation.  Increasing fluid intake and taking a  fiber supplement (such as Metamucil, Citrucel, FiberCon, MiraLax, etc) 1-2 times a day regularly will usually help prevent this problem from occurring.  A mild laxative (prune juice, Milk of Magnesia, MiraLax, etc) should be taken according to package directions if there are no bowel movements after 48 hours.   °5. Watch out for diarrhea.  If you have many loose bowel movements, simplify your diet to bland foods & liquids for a few days.  Stop any stool softeners and decrease your fiber supplement.  Switching to mild anti-diarrheal medications (Kayopectate, Pepto Bismol) can help.  If this worsens or does not improve, please call us. °6. Wash / shower every day.  You may shower over the dressings as they are waterproof.  Continue to shower over incision(s) after the dressing is off. °7. Remove your waterproof bandages 5 days after surgery.  You may leave the incision open to air.  You may replace a dressing/Band-Aid to cover the incision for comfort if you wish.  °8. ACTIVITIES as tolerated:   °a. You may resume regular (light) daily activities beginning the next day--such as daily self-care, walking, climbing stairs--gradually increasing activities as tolerated.  If you can walk 30 minutes without difficulty, it is safe to try more intense activity such as jogging, treadmill, bicycling, low-impact aerobics, swimming, etc. °b. Save the most intensive and strenuous activity for last such as sit-ups, heavy lifting, contact sports, etc  Refrain from any heavy lifting or straining until you are off narcotics for pain control.   °c. DO NOT PUSH THROUGH PAIN.  Let pain be your guide: If it hurts to do something, don't do it.  Pain is your body warning you to avoid that activity for another week until the pain goes down. °d. You may drive when you are no longer taking prescription pain medication, you can comfortably wear a seatbelt, and you can safely maneuver your car and apply brakes. °e. You may have sexual intercourse  when it is comfortable.  °9. FOLLOW UP in our office °a. Please call [facility] at [phone] to set up an appointment to see your surgeon in the office for a follow-up appointment approximately 2-3 weeks after your surgery. °b. Make sure that you call for this appointment the day you arrive home to insure a convenient appointment time. °10. IF YOU HAVE DISABILITY OR FAMILY LEAVE FORMS, BRING THEM TO THE OFFICE FOR PROCESSING.  DO NOT GIVE THEM TO YOUR DOCTOR. ° ° °WHEN TO CALL US [phone]: °1. Poor pain control °2. Reactions / problems with new medications (rash/itching, nausea, etc)  °3. Fever over 101.5 F (38.5 C) °4. Inability to urinate °5. Nausea and/or vomiting °6. Worsening swelling or bruising °7. Continued bleeding from incision. °8. Increased pain, redness, or drainage from the incision ° ° The clinic staff is available to answer your questions during regular business hours (8:30am-5pm).  Please don’t hesitate to call and ask to speak to one of our nurses for clinical concerns.  ° If you have a medical emergency, go to the nearest emergency room or call 911. ° A surgeon from [facility] is always on call at the hospitals ° ° °[facility] °[address], [address], [place],   [address] ? °MAIN: [phone] ? TOLL FREE: [phone] ?  °FAX [phone] °[email or web address] ° ° °

## 2016-03-10 NOTE — Progress Notes (Signed)
Went over discharge instructions with patient and caregiver at bedside. Prescriptions given to patient. All questions and concerns addressed.

## 2016-03-10 NOTE — NC FL2 (Signed)
[place] MEDICAID FL2 LEVEL OF CARE SCREENING TOOL     IDENTIFICATION  Patient Name: Angelica Garza Birthdate: 1945/10/04 Sex: female Admission Date (Current Location): 03/05/2016  [facility] and [place] Number:   [name] [place]) [phone] Facility and Address:  [facility],  [address], [place] [address]      Provider Number: [id number]  Attending Physician Name and Address:  Karie Soda, MD  Relative Name and Phone Number:       Current Level of Care: Hospital Recommended Level of Care: Skilled Nursing Facility Prior Approval Number:    Date Approved/Denied:   PASRR Number:    Discharge Plan: SNF    Current Diagnoses: Patient Active Problem List   Diagnosis Date Noted  . Tachycardia   . PAF (paroxysmal atrial fibrillation) (HCC)   . Preoperative cardiovascular examination   . Typical atrial flutter (HCC)   . Contraindication to anticoagulation therapy   . Incarcerated paraesophageal hernia s/p robotic repair 03/05/2016 03/05/2016  . Chronic Cameron ulcer 03/05/2016  . Delayed gastric emptying s/p pyloroplasty 03/05/2016 03/05/2016  . Degenerative arthritis   . Constipation   . Essential hypertension 11/01/2015  . GERD (gastroesophageal reflux disease) 11/01/2015    Orientation RESPIRATION BLADDER Height & Weight     Self, Situation, Time, Place  Normal Continent Weight: 166 lb (75.3 kg) Height:  5\' 3"  (160 cm)  BEHAVIORAL SYMPTOMS/MOOD NEUROLOGICAL BOWEL NUTRITION STATUS      Continent Diet (Dysphasia 1)  AMBULATORY STATUS COMMUNICATION OF NEEDS Skin   Extensive Assist Verbally Surgical wounds (Incision (Closed) 03/05/16 Abdomen Other (Comment))                       Personal Care Assistance Level of Assistance  Bathing, Dressing Bathing Assistance: Limited assistance   Dressing Assistance: Limited assistance     Functional Limitations Info             SPECIAL CARE FACTORS FREQUENCY  PT (By licensed PT), OT (By licensed  OT)     PT Frequency: 5 OT Frequency: 5            Contractures      Additional Factors Info  Code Status, Allergies Code Status Info: Fullcode Allergies Info: Codeine           Current Medications (03/10/2016):  This is the current hospital active medication list Current Facility-Administered Medications  Medication Dose Route Frequency Provider Last Rate Last Dose  . acetaminophen (TYLENOL) tablet 1,000 mg  1,000 mg Oral TID WC & HS Karie Soda, MD   1,000 mg at 03/10/16 1145  . amLODipine (NORVASC) tablet 5 mg  5 mg Oral Daily Karie Soda, MD   5 mg at 03/10/16 0912  . butalbital-acetaminophen-caffeine (FIORICET, ESGIC) 50-325-40 MG per tablet 1 tablet  1 tablet Oral Daily PRN Karie Soda, MD      . Melene Muller ON 04/03/2016] cyanocobalamin ((VITAMIN B-12)) injection 1,000 mcg  1,000 mcg Intramuscular Q30 days Karie Soda, MD      . diphenhydrAMINE (BENADRYL) injection 12.5-25 mg  12.5-25 mg Intravenous Q6H PRN Karie Soda, MD   12.5 mg at 03/07/16 2105  . enoxaparin (LOVENOX) injection 40 mg  40 mg Subcutaneous Q24H Karie Soda, MD   40 mg at 03/10/16 0949  . fluticasone (FLONASE) 50 MCG/ACT nasal spray 1 spray  1 spray Each Nare Daily Karie Soda, MD   1 spray at 03/10/16 [phone]  . fluticasone furoate-vilanterol (BREO ELLIPTA) 100-25 MCG/INH 1 puff  1  puff Inhalation Daily Karie SodaSteven Gross, MD   1 puff at 03/10/16 [phone]  . gabapentin (NEURONTIN) capsule 300 mg  300 mg Oral TID Karie SodaSteven Gross, MD   300 mg at 03/10/16 0913  . guaiFENesin-codeine 100-10 MG/5ML solution 5 mL  5 mL Oral TID PRN Karie SodaSteven Gross, MD      . hydrALAZINE (APRESOLINE) injection 10 mg  10 mg Intravenous Q2H PRN Karie SodaSteven Gross, MD      . HYDROmorphone (DILAUDID) injection 0.5 mg  0.5 mg Intravenous Q4H PRN Emelia LoronMatthew Wakefield, MD      . lip balm (CARMEX) ointment 1 application  1 application Topical BID Karie SodaSteven Gross, MD   1 application at 03/10/16 1000  . losartan (COZAAR) tablet 100 mg  100 mg Oral Daily Karie SodaSteven Gross,  MD   100 mg at 03/10/16 0914  . magic mouthwash  15 mL Oral QID PRN Karie SodaSteven Gross, MD      . MEDLINE mouth rinse  15 mL Mouth Rinse BID Karie SodaSteven Gross, MD   15 mL at 03/10/16 1000  . menthol-cetylpyridinium (CEPACOL) lozenge 3 mg  1 lozenge Oral PRN Karie SodaSteven Gross, MD   3 mg at 03/06/16 0050  . methocarbamol (ROBAXIN) tablet 500 mg  500 mg Oral Q8H PRN Emelia LoronMatthew Wakefield, MD      . metoCLOPramide [facility](REGLAN) injection 5-10 mg  5-10 mg Intravenous Q6H PRN Karie SodaSteven Gross, MD      . metoprolol (LOPRESSOR) injection 5 mg  5 mg Intravenous Q6H PRN Karie SodaSteven Gross, MD   5 mg at 03/09/16 2002  . ondansetron (ZOFRAN) injection 4 mg  4 mg Intravenous Q6H PRN Karie SodaSteven Gross, MD   4 mg at 03/06/16 0050   Or  . ondansetron (ZOFRAN) 8 mg in sodium chloride 0.9 % 50 mL IVPB  8 mg Intravenous Q6H PRN Karie SodaSteven Gross, MD      . oxyCODONE (Oxy IR/ROXICODONE) immediate release tablet 5-10 mg  5-10 mg Oral Q4H PRN Karie SodaSteven Gross, MD   5 mg at 03/10/16 0703  . pantoprazole (PROTONIX) EC tablet 40 mg  40 mg Oral Daily Karie SodaSteven Gross, MD   40 mg at 03/10/16 0912  . phenol (CHLORASEPTIC) mouth spray 2 spray  2 spray Mouth/Throat PRN Karie SodaSteven Gross, MD      . primidone (MYSOLINE) tablet 50 mg  50 mg Oral QHS Karie SodaSteven Gross, MD   50 mg at 03/09/16 2058  . prochlorperazine (COMPAZINE) injection 5-10 mg  5-10 mg Intravenous Q4H PRN Karie SodaSteven Gross, MD      . simethicone [facility](MYLICON) chewable tablet 40 mg  40 mg Oral Q6H PRN Karie SodaSteven Gross, MD      . traZODone (DESYREL) tablet 100 mg  100 mg Oral QHS Karie SodaSteven Gross, MD   100 mg at 03/09/16 2058  . verapamil (CALAN-SR) CR tablet 240 mg  240 mg Oral Daily Lars MassonKatarina H Nelson, MD   240 mg at 03/10/16 1144     Discharge Medications: Please see discharge summary for a list of discharge medications.  Relevant Imaging Results:  Relevant Lab Results:   Additional Information SSN: [id number]  Arlyss RepressHarrison, Merlyn Conley F, LCSW

## 2016-03-10 NOTE — Care Management Important Message (Signed)
Important Message  Patient Details  Name: Angelica KerbsSylvia A Frediani MRN: [id number] Date of Birth: [birth year]   Medicare Important Message Given:  Yes    Haskell FlirtJamison, Jessi Pitstick 03/10/2016, 10:32 AMImportant Message  Patient Details  Name: Angelica KerbsSylvia A Escareno MRN: [id number] Date of Birth: [birth year]   Medicare Important Message Given:  Yes    Haskell FlirtJamison, Zaelyn Noack 03/10/2016, 10:32 AM

## 2016-03-10 NOTE — Progress Notes (Signed)
Occupational Therapy Treatment Patient Details Name: Angelica Garza MRN: [id number] DOB: 1945-10-25 Today's Date: 03/10/2016    History of present illness The patient is a 70 year old female with h/o hypertension, who presented 03/05/16  with a paraesophageal hiatal hernia, s/p robotic laparoscopic Nissen fundoplication on 03/05/2016. While in the hospital she has developed 1 episode of a very short a-flutter with 2:1 block and 3 very short episodes of atrial fibrillation with RVR.  PMH includes:  cerebellar degeneration, COPD, cor pulmonale, h/o falls   OT comments  Pt wants to go home with caregiver and not SNF  Follow Up Recommendations  Supervision/Assistance - 24 hour    Equipment Recommendations  None recommended by OT    Recommendations for Other Services      Precautions / Restrictions Precautions Precautions: Fall Restrictions Weight Bearing Restrictions: No       Mobility Bed Mobility Overal bed mobility: Needs Assistance             General bed mobility comments: pt up in chair  Transfers Overall transfer level: Needs assistance Equipment used: Rolling walker (2 wheeled) Transfers: Sit to/from Stand Sit to Stand: Min guard Stand pivot transfers: Min guard            Balance                                   ADL Overall ADL's : Needs assistance/impaired     Grooming: Cueing for safety;Standing               Lower Body Dressing: Minimal assistance;Sit to/from stand   Toilet Transfer: Supervision/safety;RW;Ambulation;Cueing for sequencing;Cueing for safety   Toileting- Architect and Hygiene: Min guard;Sit to/from stand;Cueing for sequencing;Cueing for safety         General ADL Comments: caregiver will A as needed.  Pt wants to go home with caregiver      Vision                     Perception     Praxis      Cognition   Behavior During Therapy: [facility] for tasks  assessed/performed;Impulsive Overall Cognitive Status: Within Functional Limits for tasks assessed                       Extremity/Trunk Assessment               Exercises     Shoulder Instructions       General Comments      Pertinent Vitals/ Pain       Pain Assessment: No/denies pain  Home Living                                          Prior Functioning/Environment              Frequency  Min 2X/week        Progress Toward Goals  OT Goals(current goals can now be found in the care plan section)  Progress towards OT goals: Progressing toward goals     Plan Discharge plan needs to be updated    Co-evaluation                 End of Session Equipment Utilized During Treatment: Gait belt   Activity  Tolerance Patient tolerated treatment well   Patient Left Other (comment) (in bathroom with caregiver)   Nurse Communication Mobility status        Time: [id number] OT Time Calculation (min): 10 min  Charges: OT General Charges $OT Visit: 1 Procedure OT Treatments $Self Care/Home Management : 8-22 mins  Daliya Parchment, Metro KungLorraine D 03/10/2016, 11:44 AM

## 2016-03-10 NOTE — Clinical Social Work Note (Signed)
Clinical Social Work Assessment  Patient Details  Name: Angelica Garza MRN: [id number] Date of Birth: [birth year]  Date of referral:  03/10/16               Reason for consult:  Facility Placement                Permission sought to share information with:  Oceanographeracility Contact Representative Permission granted to share information::  Yes, Verbal Permission Granted  Name::        Agency::     Relationship::     Contact Information:     Housing/Transportation Living arrangements for the past 2 months:  Single Family Home Source of Information:  Patient Patient Interpreter Needed:  None Criminal Activity/Legal Involvement Pertinent to Current Situation/Hospitalization:  No - Comment as needed Significant Relationships:  Adult Children Lives with:  Self Do you feel safe going back to the place where you live?  No Need for family participation in patient care:  No (Coment)  Care giving concerns:  CSW reviewed PT evaluation recommending SNF at discharge.    Social Worker assessment / plan:  CSW spoke with patient who is agreeable with plan for SNF at discharge & requesting [facility] or [facility] in [place]. CSW completed FL2 and sent referral to both, awaiting call back re: bed availability.   Employment status:  Retired Health and safety inspectornsurance information:  Armed forces operational officerMedicare, Medicaid [place] PT Recommendations:  Skilled Nursing Facility Information / Referral to community resources:  Skilled Nursing Facility  Patient/Family's Response to care:  Patient states that she's never been to a rehab facility before but is open to going, requesting to stay in [place].   Patient/Family's Understanding of and Emotional Response to Diagnosis, Current Treatment, and Prognosis:  Patient was glad to be able to eat and is anxious to discharge.   Emotional Assessment Appearance:  Appears stated age Attitude/Demeanor/Rapport:    Affect (typically observed):    Orientation:  Oriented to Self, Oriented to Place,  Oriented to  Time, Oriented to Situation Alcohol / Substance use:    Psych involvement (Current and /or in the community):     Discharge Needs  Concerns to be addressed:    Readmission within the last 30 days:    Current discharge risk:    Barriers to Discharge:      Arlyss RepressHarrison, Renelle Stegenga F, LCSW 03/10/2016, 1:39 PM

## 2016-03-10 NOTE — Clinical Social Work Placement (Signed)
[facility]. CSW has completed FL2 & will continue to follow and assist with discharge when ready.    Lincoln MaxinKelly Analaya Hoey, LCSW [facility] Clinical Social Worker cell #: [phone]     CLINICAL SOCIAL WORK PLACEMENT  NOTE  Date:  03/10/2016  Patient Details  Name: Angelica KerbsSylvia A Kortz MRN: [id number] Date of Birth: [birth year]  Clinical Social Work is seeking post-discharge placement for this patient at the Skilled  Nursing Facility level of care (*CSW will initial, date and re-position this form in  chart as items are completed):  Yes   Patient/family provided with [facility] Clinical Social Work Department's list of facilities offering this level of care within the geographic area requested by the patient (or if unable, by the patient's family).  Yes   Patient/family informed of their freedom to choose among providers that offer the needed level of care, that participate in Medicare, Medicaid or managed care program needed by the patient, have an available bed and are willing to accept the patient.  Yes   Patient/family informed of [facility]'s ownership interest in [facility] and [facility], as well as of the fact that they are under no obligation to receive care at these facilities.  PASRR submitted to EDS on       PASRR number received on       Existing PASRR number confirmed on       FL2 transmitted to all facilities in geographic area requested by pt/family on 03/10/16     FL2 transmitted to all facilities within larger geographic area on       Patient informed that his/her managed care company has contracts with or will negotiate with certain facilities, including the following:        Yes   Patient/family informed of bed offers received.  [facility]     Physician recommends and patient chooses bed at      [facility] on   .  Patient to be transferred to facility by       Patient family notified on   of transfer.  Name of family member notified:        PHYSICIAN       Additional Comment:    _______________________________________________ Arlyss RepressHarrison, Kynsleigh Westendorf F, LCSW 03/10/2016, 3:39 PM

## 2016-03-10 NOTE — Progress Notes (Signed)
Patient Name: Angelica CritchleySylvia A Garza Date of Encounter: 03/10/2016  Principal Problem:   Incarcerated paraesophageal hernia s/p robotic repair 03/05/2016 Active Problems:   Essential hypertension   Chronic Sheria Langameron ulcer   Delayed gastric emptying s/p pyloroplasty 03/05/2016   Degenerative arthritis   Constipation   Tachycardia   PAF (paroxysmal atrial fibrillation) (HCC)   Preoperative cardiovascular examination   Typical atrial flutter (HCC)   Contraindication to anticoagulation therapy   Primary Cardiologist: Dr Delton SeeNelson here, Dr Daryel NovemberGary Miller in [place] Patient Profile: 70 yo female w/ hx HTN, PAF (on ASA & Verapamil), not anticoagulated 2nd frequent falls. Admitted 09/13 w/ hernia>>robotic Nissen. Cards saw 09/17 for atrial flutter/fib  SUBJECTIVE: Feels better, wants d/c   OBJECTIVE Vitals:   03/09/16 1337 03/09/16 2216 03/10/16 0535 03/10/16 0804  BP: (!) 157/70 (!) 146/79 (!) 163/82 (!) 148/70  Pulse: 85 68 78   Resp: 20 18 18 18   Temp: 98.4 F (36.9 C) 97.7 F (36.5 C) 98.6 F (37 C) 97.7 F (36.5 C)  TempSrc: Oral Oral Oral Oral  SpO2: 97% 95% 94%   Weight:      Height:        Intake/Output Summary (Last 24 hours) at 03/10/16 [id number] Last data filed at 03/09/16 2300  Gross per 24 hour  Intake              960 ml  Output                0 ml  Net              960 ml   Filed Weights   03/05/16 0623  Weight: 166 lb (75.3 kg)    PHYSICAL EXAM General: Well developed, well nourished, female in no acute distress. Head: Normocephalic, atraumatic.  Neck: Supple without bruits, JVD not elevated. Lungs:  Resp regular and unlabored, CTA but poor effort due to pain Heart: RRR, S1, S2, no S3, S4, or murmur; no rub. Abdomen: Soft, non-tender, non-distended, BS + x 4.  Extremities: No clubbing, cyanosis, edema.  Neuro: Alert and oriented X 3. Moves all extremities spontaneously. Psych: Normal affect.  LABS: CBC: Recent Labs  03/07/16 1606 03/10/16 0428  WBC 12.7*  7.4  HGB 11.0* 11.9*  HCT 34.3* 36.4  MCV 89.8 87.3  PLT 319 402*   Basic Metabolic Panel: Recent Labs  03/07/16 1606 03/10/16 0428  NA 132* 137  K 4.3 3.5  CL 98* 100*  CO2 28 29  GLUCOSE 139* 99  BUN 14 9  CREATININE 0.79 0.71  CALCIUM 8.8* 9.1    TELE:  Converted to SR at 21:00 last pm    -personally viewed  Current Medications:  . acetaminophen  1,000 mg Oral TID WC & HS  . amLODipine  5 mg Oral Daily  . [START ON 04/03/2016] cyanocobalamin  1,000 mcg Intramuscular Q30 days  . enoxaparin (LOVENOX) injection  40 mg Subcutaneous Q24H  . fluticasone  1 spray Each Nare Daily  . fluticasone furoate-vilanterol  1 puff Inhalation Daily  . gabapentin  300 mg Oral TID  . lip balm  1 application Topical BID  . losartan  100 mg Oral Daily  . mouth rinse  15 mL Mouth Rinse BID  . pantoprazole  40 mg Oral Daily  . primidone  50 mg Oral QHS  . traZODone  100 mg Oral QHS  . verapamil  240 mg Oral Daily      ASSESSMENT AND PLAN: Principal Problem:  Incarcerated paraesophageal hernia s/p robotic repair 03/05/2016 - per [facility]    Tachycardia    PAF (paroxysmal atrial fibrillation) (HCC)    Typical atrial flutter (HCC) - Verapamil had not been ordered on admission - restarted on 09/17, dose increased 180 mg>>240 mg. - ** unclear if it is appropriate for her to be on Norvasc and Verapamil**     Contraindication to anticoagulation therapy - pt is disabled, uses walker at baseline, high risk for falls - CHA2DS2VASc is 59 (female, age x 1, HTN) - with increased bleeding risk from recent surgery, and elevated fall risk, will ask pt to f/u with primary cardiologist for discussion of anticoag strategy, no changes now.  Otherwise, per [facility] Active Problems:   Essential hypertension   Chronic Sheria Lang ulcer   Delayed gastric emptying s/p pyloroplasty 03/05/2016   Degenerative arthritis   Constipation   Preoperative cardiovascular examination    Signed, Leanna Battles 8:33 AM 03/10/2016  Personally seen and examined. Agree with above. I am fine with her on both amlodipine and verapamil as she was on this at home (both calcium channel blockers but one dihydropyridine and one non-dihydropyridine). RRR, CTAB Has been avoiding anticoagulation because of prior fall risk.   Donato Schultz, MD

## 2016-05-23 DEATH — deceased

## 2017-10-01 IMAGING — DX DG CHEST 1V PORT
1 series · 1 of 1 positions shown · non-contrast
Comparison: None.

CLINICAL DATA: 70-year-old presenting with acute onset of cough
which began today. Postop day 2 after repair of paraesophageal
hiatal hernia and Nissen fundoplication.

EXAM:
PORTABLE CHEST 1 VIEW

[chest ap]
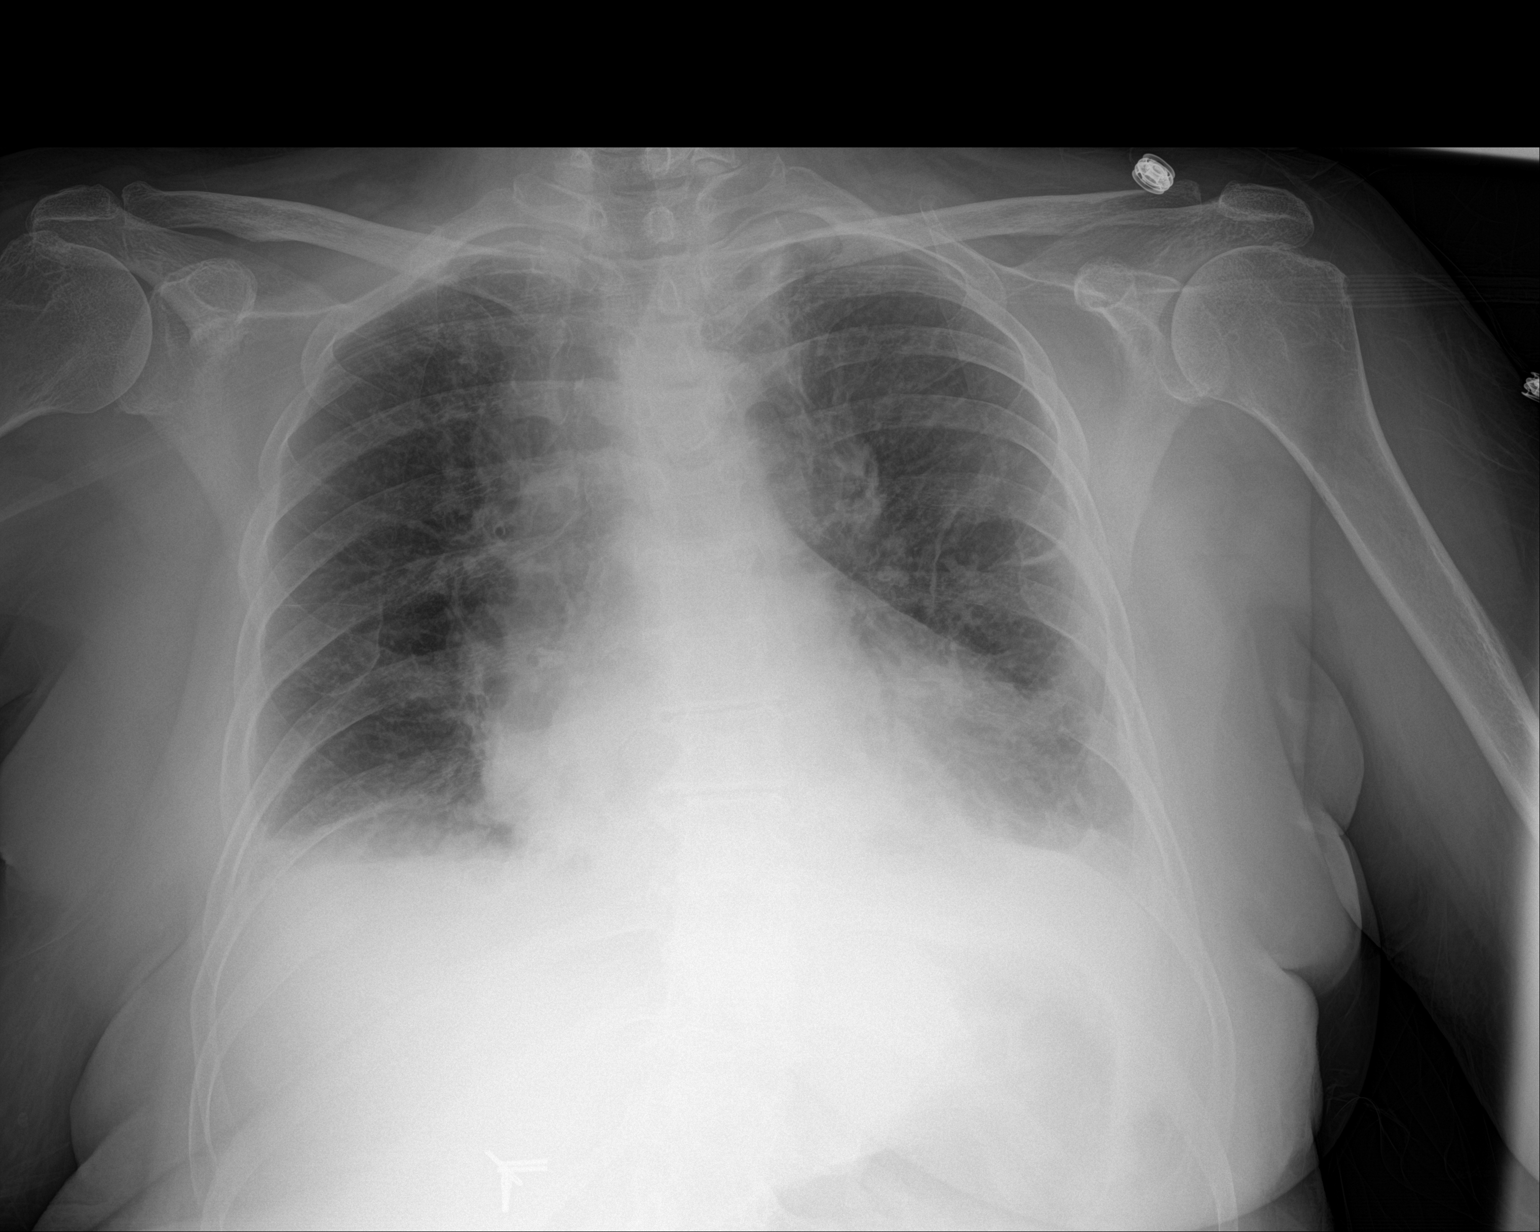

[1 of 1 positions shown; findings below may reference images not displayed]

FINDINGS: Cardiac silhouette upper normal in size to slightly enlarged for AP
portable technique. Pulmonary vascularity normal. Bilateral pleural
effusions and associated mild consolidation in the lower lobes.
Lungs otherwise clear.
IMPRESSION: 1. Small bilateral pleural effusions and associated mild passive
atelectasis and/or pneumonia involving the lower lobes.
2. Borderline heart size without evidence of pulmonary edema.
